# Patient Record
Sex: Female | Born: 1995 | Race: White | Hispanic: No | Marital: Single | Smoking: Never smoker
Health system: Southern US, Community
[De-identification: ages and names within clinical notes are randomized; demographics above are authoritative.]

## PROBLEM LIST (undated history)

## (undated) DIAGNOSIS — R0981 Nasal congestion: Secondary | ICD-10-CM

## (undated) DIAGNOSIS — R2232 Localized swelling, mass and lump, left upper limb: Secondary | ICD-10-CM

## (undated) DIAGNOSIS — F909 Attention-deficit hyperactivity disorder, unspecified type: Secondary | ICD-10-CM

## (undated) HISTORY — PX: TYMPANOSTOMY TUBE PLACEMENT: SHX32

## (undated) HISTORY — PX: WISDOM TOOTH EXTRACTION: SHX21

---
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
## 2013-01-01 LAB — STREP A DNA PROBE: GASP: NEGATIVE

## 2013-01-02 ENCOUNTER — Telehealth: Payer: Self-pay | Admitting: *Deleted

## 2013-04-11 ENCOUNTER — Ambulatory Visit (INDEPENDENT_AMBULATORY_CARE_PROVIDER_SITE_OTHER): Payer: 59 | Admitting: Physician Assistant

## 2013-04-11 ENCOUNTER — Encounter: Payer: Self-pay | Admitting: Physician Assistant

## 2013-04-11 VITALS — BP 117/79 | HR 75 | Wt 140.0 lb

## 2013-04-11 DIAGNOSIS — M6740 Ganglion, unspecified site: Secondary | ICD-10-CM

## 2013-04-11 DIAGNOSIS — M67432 Ganglion, left wrist: Secondary | ICD-10-CM

## 2013-04-11 NOTE — Patient Instructions (Addendum)
Ibuprofen 800mg  up to TID.   Ganglion Cyst A ganglion cyst is a noncancerous, fluid-filled lump that occurs near joints or tendons. The ganglion cyst grows out of a joint or the lining of a tendon. It most often develops in the hand or wrist but can also develop in the shoulder, elbow, hip, knee, ankle, or foot. The round or oval ganglion can be pea sized or larger than a grape. Increased activity may enlarge the size of the cyst because more fluid starts to build up.  CAUSES  It is not completely known what causes a ganglion cyst to grow. However, it may be related to:  Inflammation or irritation around the joint.  An injury.  Repetitive movements or overuse.  Arthritis. SYMPTOMS  A lump most often appears in the hand or wrist, but can occur in other areas of the body. Generally, the lump is painless without other symptoms. However, sometimes pain can be felt during activity or when pressure is applied to the lump. The lump may even be tender to the touch. Tingling, pain, numbness, or muscle weakness can occur if the ganglion cyst presses on a nerve. Your grip may be weak and you may have less movement in your joints.  DIAGNOSIS  Ganglion cysts are most often diagnosed based on a physical exam, noting where the cyst is and how it looks. Your caregiver will feel the lump and may shine a light alongside it. If it is a ganglion, a light often shines through it. Your caregiver may order an X-ray, ultrasound, or MRI to rule out other conditions. TREATMENT  Ganglions usually go away on their own without treatment. If pain or other symptoms are involved, treatment may be needed. Treatment is also needed if the ganglion limits your movement or if it gets infected. Treatment options include:  Wearing a wrist or finger brace or splint.  Taking anti-inflammatory medicine.  Draining fluid from the lump with a needle (aspiration).  Injecting a steroid into the joint.  Surgery to remove the ganglion  cyst and its stalk that is attached to the joint or tendon. However, ganglion cysts can grow back. HOME CARE INSTRUCTIONS   Do not press on the ganglion, poke it with a needle, or hit it with a heavy object. You may rub the lump gently and often. Sometimes fluid moves out of the cyst.  Only take medicines as directed by your caregiver.  Wear your brace or splint as directed by your caregiver. SEEK MEDICAL CARE IF:   Your ganglion becomes larger or more painful.  You have increased redness, red streaks, or swelling.  You have pus coming from the lump.  You have weakness or numbness in the affected area. MAKE SURE YOU:   Understand these instructions.  Will watch your condition.  Will get help right away if you are not doing well or get worse. Document Released: 10/01/2000 Document Revised: 06/28/2012 Document Reviewed: 11/28/2007 [facility] Patient Information 2014 [place], [place].

## 2013-04-11 NOTE — Progress Notes (Signed)
  Subjective:    Patient ID: Patricia Hammond, female    DOB: 1996/03/04, 17 y.o.   MRN: [id number]  HPI Patient 17 year old female who presents to the clinic with a nodule on her left wrist. She has noticed it for about one month now. It is not painful. She is involved in a lot of sports but denies any known repetitive movement of her wrist. It is mobile. She has not tried anything to make better or worse. It does not seem to be increasing in size.   Review of Systems     Objective:   Physical Exam  Skin:             Assessment & Plan:  Ganglion cyst-reassured patient that the cyst is benign. Wrapped with Ace bandage for compression. Encouraged patient to take anti-inflammatories for the next couple of weeks at ibuprofen 800 mg up to 3 times a day. Gave handout on ganglion cyst. Discussed with patient if does not improve can consider injection or aspiration. Followup as needed.

## 2013-04-28 ENCOUNTER — Encounter: Payer: Self-pay | Admitting: *Deleted

## 2013-04-28 ENCOUNTER — Emergency Department (INDEPENDENT_AMBULATORY_CARE_PROVIDER_SITE_OTHER)
Admission: EM | Admit: 2013-04-28 | Discharge: 2013-04-28 | Disposition: A | Discharge status: Home / Self Care | Payer: 59 | Source: Home / Self Care | Attending: Family Medicine | Admitting: Family Medicine

## 2013-04-28 DIAGNOSIS — R300 Dysuria: Secondary | ICD-10-CM

## 2013-04-28 LAB — POCT URINALYSIS DIP (MANUAL ENTRY)
Nitrite, UA: NEGATIVE
Urobilinogen, UA: 0.2 (ref 0–1)
pH, UA: 6 (ref 5–8)

## 2013-04-28 MED ORDER — CEPHALEXIN 500 MG PO CAPS: 500.0000 mg | ORAL_CAPSULE | Freq: Three times a day (TID) | ORAL | Status: DC

## 2013-04-28 NOTE — ED Provider Notes (Signed)
   History    CSN: [id number] Arrival date & time 04/28/13  1725  First MD Initiated Contact with Patient 04/28/13 1726     Chief Complaint  Patient presents with  . Dysuria  . Urinary Urgency    HPI  DYSURIA Onset:  1 day Description: dysuria, increased urinary frequency Modifying factors: none  Symptoms Urgency:  yes Frequency: yes  Hesitancy:  yes Hematuria:  no Flank Pain:  no Fever: no Nausea/Vomiting:  no Missed LMP: no STD exposure: no Discharge: no Irritants: no Rash: no  Red Flags   More than 3 UTI's last 12 months:  no PMH of  Diabetes or Immunosuppression:  no Renal Disease/Calculi: no Urinary Tract Abnormality:  no Instrumentation or Trauma: no    Past Medical History  Diagnosis Date  . ADHD (attention deficit hyperactivity disorder)    Past Surgical History  Procedure Laterality Date  . Tympanostomy tube placement     Family History  Problem Relation Age of Onset  . Hypertension Maternal Aunt   . Hypertension Paternal Aunt   . Diabetes Maternal Grandfather   . Heart disease Maternal Grandfather   . Heart disease Paternal Grandfather    History  Substance Use Topics  . Smoking status: Never Smoker   . Smokeless tobacco: Never Used  . Alcohol Use: No   OB History   Grav Para Term Preterm Abortions TAB SAB Ect Mult Living                 Review of Systems  All other systems reviewed and are negative.    Allergies  Review of patient's allergies indicates no known allergies.  Home Medications   Current Outpatient Rx  Name  Route  Sig  Dispense  Refill  . atomoxetine (STRATTERA) 60 MG capsule   Oral   Take 1 capsule (60 mg total) by mouth daily.   90 capsule   1   . cephALEXin (KEFLEX) 500 MG capsule   Oral   Take 1 capsule (500 mg total) by mouth 3 (three) times daily.   21 capsule   0   . levonorgestrel (MIRENA) 20 MCG/24HR IUD   Intrauterine   1 each by Intrauterine route once.          There were no vitals  taken for this visit. Physical Exam  Constitutional: She appears well-developed and well-nourished.  HENT:  Head: Normocephalic and atraumatic.  Eyes: Conjunctivae are normal. Pupils are equal, round, and reactive to light.  Neck: Normal range of motion. Neck supple.  Cardiovascular: Normal rate, regular rhythm and normal heart sounds.   Pulmonary/Chest: Effort normal and breath sounds normal.  Abdominal: Soft. Bowel sounds are normal.  + suprapubic tenderness  Musculoskeletal: Normal range of motion.  Neurological: She is alert.  Skin: Skin is warm.    ED Course  Procedures (including critical care time) Labs Reviewed - No data to display No results found. 1. Dysuria     MDM  Will treat with keflex Urine culture Discussed infectious and gu red flags.     The patient and/or caregiver has been counseled thoroughly with regard to treatment plan and/or medications prescribed including dosage, schedule, interactions, rationale for use, and possible side effects and they verbalize understanding. Diagnoses and expected course of recovery discussed and will return if not improved as expected or if the condition worsens. Patient and/or caregiver verbalized understanding.       Doree Albee, MD 04/28/13 [phone]

## 2013-04-28 NOTE — ED Notes (Signed)
Patient c/o burning and pain with urination x 3 hours ago. Patient has not tried nothing.

## 2013-05-01 ENCOUNTER — Telehealth: Payer: Self-pay | Admitting: Emergency Medicine

## 2013-11-07 ENCOUNTER — Encounter: Payer: Self-pay | Admitting: Obstetrics & Gynecology

## 2013-11-07 ENCOUNTER — Ambulatory Visit (INDEPENDENT_AMBULATORY_CARE_PROVIDER_SITE_OTHER): Payer: 59 | Admitting: Obstetrics & Gynecology

## 2013-11-07 VITALS — BP 122/80 | HR 74 | Resp 16 | Ht 69.0 in | Wt 141.0 lb

## 2013-11-07 DIAGNOSIS — Z30431 Encounter for routine checking of intrauterine contraceptive device: Secondary | ICD-10-CM

## 2013-11-07 DIAGNOSIS — Z7251 High risk heterosexual behavior: Secondary | ICD-10-CM

## 2013-11-07 DIAGNOSIS — T8339XA Other mechanical complication of intrauterine contraceptive device, initial encounter: Secondary | ICD-10-CM

## 2013-11-07 DIAGNOSIS — T8332XA Displacement of intrauterine contraceptive device, initial encounter: Secondary | ICD-10-CM | POA: Insufficient documentation

## 2013-11-07 NOTE — Progress Notes (Signed)
   Subjective:    Patient ID: Patricia Hammond, female    DOB: 12/29/1995, 18 y.o.   MRN: [id number]  HPI Pt presents for IUD string check.  Pt gets her menses once a month.  Pt has no complaints of pain or discharge.  Pt requesting full STD panel   Review of Systems  All other systems reviewed and are negative.       Objective:   Physical Exam  Constitutional: She appears well-developed and well-nourished.  Pulmonary/Chest: Effort normal.  Abdominal: Soft. She exhibits no distension. There is no tenderness.  Genitourinary: Vagina normal.  Cervix nml in appearance.  No strings visualized.  Musculoskeletal: She exhibits no edema.  Neurological: She is alert.  Skin: Skin is warm and dry.  Psychiatric: She has a normal mood and affect.          Assessment & Plan:  Bedside US limited  IUD Strings seen  STD panel--blood and vaginal.  RTC prn.

## 2013-11-07 NOTE — Addendum Note (Signed)
Addended by: Asencion Islam on: 11/07/2013 11:29 AM   Modules accepted: Orders

## 2013-11-08 ENCOUNTER — Telehealth: Payer: Self-pay | Admitting: *Deleted

## 2013-11-08 LAB — RPR

## 2013-11-08 LAB — WET PREP BY MOLECULAR PROBE
CANDIDA SPECIES: NEGATIVE
GARDNERELLA VAGINALIS: NEGATIVE
TRICHOMONAS VAG: NEGATIVE

## 2013-11-08 LAB — GC/CHLAMYDIA PROBE AMP
CT Probe RNA: NEGATIVE
GC Probe RNA: NEGATIVE

## 2013-11-08 LAB — HEPATITIS B SURFACE ANTIGEN: HEP B S AG: NEGATIVE

## 2013-11-08 LAB — HEPATITIS C ANTIBODY: HCV Ab: NEGATIVE

## 2013-11-08 LAB — HIV ANTIBODY (ROUTINE TESTING W REFLEX): HIV: NONREACTIVE

## 2013-11-08 NOTE — Telephone Encounter (Signed)
Called pt to adv all labs are normal but still waiting on GC/Chlam to be resulted will call when results are in

## 2013-11-18 ENCOUNTER — Encounter (HOSPITAL_COMMUNITY): Payer: Self-pay | Admitting: Emergency Medicine

## 2013-11-18 ENCOUNTER — Emergency Department (HOSPITAL_COMMUNITY)
Admission: EM | Admit: 2013-11-18 | Discharge: 2013-11-18 | Disposition: A | Discharge status: Home / Self Care | Payer: 59 | Attending: Emergency Medicine | Admitting: Emergency Medicine

## 2013-11-18 DIAGNOSIS — R1110 Vomiting, unspecified: Secondary | ICD-10-CM

## 2013-11-18 DIAGNOSIS — R1084 Generalized abdominal pain: Secondary | ICD-10-CM | POA: Insufficient documentation

## 2013-11-18 DIAGNOSIS — R197 Diarrhea, unspecified: Secondary | ICD-10-CM | POA: Insufficient documentation

## 2013-11-18 DIAGNOSIS — Z8659 Personal history of other mental and behavioral disorders: Secondary | ICD-10-CM | POA: Insufficient documentation

## 2013-11-18 DIAGNOSIS — Z3202 Encounter for pregnancy test, result negative: Secondary | ICD-10-CM | POA: Insufficient documentation

## 2013-11-18 DIAGNOSIS — R6883 Chills (without fever): Secondary | ICD-10-CM | POA: Insufficient documentation

## 2013-11-18 DIAGNOSIS — R112 Nausea with vomiting, unspecified: Secondary | ICD-10-CM | POA: Insufficient documentation

## 2013-11-18 LAB — CBC WITH DIFFERENTIAL/PLATELET
Basophils Absolute: 0 10*3/uL (ref 0.0–0.1)
Basophils Relative: 0 % (ref 0–1)
EOS PCT: 0 % (ref 0–5)
Eosinophils Absolute: 0 10*3/uL (ref 0.0–0.7)
HEMATOCRIT: 45.1 % (ref 36.0–46.0)
HEMOGLOBIN: 15.6 g/dL — AB (ref 12.0–15.0)
LYMPHS ABS: 0.4 10*3/uL — AB (ref 0.7–4.0)
LYMPHS PCT: 2 % — AB (ref 12–46)
MCH: 29.9 pg (ref 26.0–34.0)
MCHC: 34.6 g/dL (ref 30.0–36.0)
MCV: 86.4 fL (ref 78.0–100.0)
MONO ABS: 0.8 10*3/uL (ref 0.1–1.0)
Monocytes Relative: 4 % (ref 3–12)
NEUTROS ABS: 18.4 10*3/uL — AB (ref 1.7–7.7)
Neutrophils Relative %: 93 % — ABNORMAL HIGH (ref 43–77)
Platelets: 155 10*3/uL (ref 150–400)
RBC: 5.22 MIL/uL — AB (ref 3.87–5.11)
RDW: 13 % (ref 11.5–15.5)
WBC: 19.7 10*3/uL — AB (ref 4.0–10.5)

## 2013-11-18 LAB — OCCULT BLOOD, POC DEVICE: FECAL OCCULT BLD: POSITIVE — AB

## 2013-11-18 LAB — COMPREHENSIVE METABOLIC PANEL
ALT: 10 U/L (ref 0–35)
AST: 17 U/L (ref 0–37)
Albumin: 4.7 g/dL (ref 3.5–5.2)
Alkaline Phosphatase: 84 U/L (ref 39–117)
BILIRUBIN TOTAL: 0.6 mg/dL (ref 0.3–1.2)
BUN: 19 mg/dL (ref 6–23)
CALCIUM: 9.9 mg/dL (ref 8.4–10.5)
CHLORIDE: 101 meq/L (ref 96–112)
CO2: 23 meq/L (ref 19–32)
CREATININE: 0.77 mg/dL (ref 0.50–1.10)
GLUCOSE: 125 mg/dL — AB (ref 70–99)
Potassium: 4.4 mEq/L (ref 3.7–5.3)
Sodium: 139 mEq/L (ref 137–147)
Total Protein: 8.1 g/dL (ref 6.0–8.3)

## 2013-11-18 LAB — URINALYSIS, ROUTINE W REFLEX MICROSCOPIC
Bilirubin Urine: NEGATIVE
GLUCOSE, UA: NEGATIVE mg/dL
HGB URINE DIPSTICK: NEGATIVE
KETONES UR: NEGATIVE mg/dL
Leukocytes, UA: NEGATIVE
Nitrite: NEGATIVE
PROTEIN: 30 mg/dL — AB
Specific Gravity, Urine: 1.028 (ref 1.005–1.030)
UROBILINOGEN UA: 0.2 mg/dL (ref 0.0–1.0)
pH: 5.5 (ref 5.0–8.0)

## 2013-11-18 LAB — POCT PREGNANCY, URINE: Preg Test, Ur: NEGATIVE

## 2013-11-18 LAB — LIPASE, BLOOD: Lipase: 18 U/L (ref 11–59)

## 2013-11-18 LAB — URINE MICROSCOPIC-ADD ON

## 2013-11-18 MED ORDER — SODIUM CHLORIDE 0.9 % IV SOLN
1000.0000 mL | Freq: Once | INTRAVENOUS | Status: AC
  Administered 2013-11-18: 1000 mL via INTRAVENOUS

## 2013-11-18 MED ORDER — ONDANSETRON HCL 4 MG/2ML IJ SOLN
4.0000 mg | Freq: Once | INTRAMUSCULAR | Status: AC
  Administered 2013-11-18: 4 mg via INTRAVENOUS
  Filled 2013-11-18: qty 2

## 2013-11-18 MED ORDER — PROMETHAZINE HCL 25 MG RE SUPP: 25.0000 mg | Freq: Four times a day (QID) | RECTAL | Status: DC | PRN

## 2013-11-18 MED ORDER — SODIUM CHLORIDE 0.9 % IV BOLUS (SEPSIS)
1000.0000 mL | Freq: Once | INTRAVENOUS | Status: AC
  Administered 2013-11-18: 1000 mL via INTRAVENOUS

## 2013-11-18 MED ORDER — KETOROLAC TROMETHAMINE 30 MG/ML IJ SOLN
30.0000 mg | Freq: Once | INTRAMUSCULAR | Status: DC
  Filled 2013-11-18 (×2): qty 1

## 2013-11-18 MED ORDER — ONDANSETRON 4 MG PO TBDP: 4.0000 mg | ORAL_TABLET | Freq: Three times a day (TID) | ORAL | Status: DC | PRN

## 2013-11-18 NOTE — ED Provider Notes (Signed)
CSN: [id number]     Arrival date & time 11/18/13  2703 History   First MD Initiated Contact with Patient 11/18/13 [phone]     Chief Complaint  Patient presents with  . Emesis  . Diarrhea   (Consider location/radiation/quality/duration/timing/severity/associated sxs/prior Treatment) HPI Comments: Patricia Hammond is a 18 year-old female with a past medical history of ADHD, presenting the Emergency Department with a chief complaint of vomiting and diarrhea.  The patient reports abrupt onset of vomiting since approximately 0100 today.  She reports 10 episodes of vomiting with streaks of blood in the last two emesis.  She reports 8 episodes of "red" diarrhea.  She reports lower abdominal pain. Deneis fever or chills.  Reports she ate spaghetti and meatballs for dinner around 1900 yesterday.  She reports her roommate ate some of her meatballs but does not have the same symptoms.  The patient states she had once glass of wine with dinner last night.    Patient is a 18 y.o. female presenting with vomiting and diarrhea. The history is provided by the patient and a parent. No language interpreter was used.  Emesis Associated symptoms: abdominal pain, chills and diarrhea   Diarrhea Associated symptoms: abdominal pain, chills and vomiting   Associated symptoms: no fever     Past Medical History  Diagnosis Date  . ADHD (attention deficit hyperactivity disorder)    Past Surgical History  Procedure Laterality Date  . Tympanostomy tube placement     Family History  Problem Relation Age of Onset  . Hypertension Maternal Aunt   . Hypertension Paternal Aunt   . Diabetes Maternal Grandfather   . Heart disease Maternal Grandfather   . Heart disease Paternal Grandfather    History  Substance Use Topics  . Smoking status: Never Smoker   . Smokeless tobacco: Never Used  . Alcohol Use: No   OB History   Grav Para Term Preterm Abortions TAB SAB Ect Mult Living   0 0 0 0 0 0 0 0 0 0      Review of  Systems  Constitutional: Positive for chills. Negative for fever.  Gastrointestinal: Positive for nausea, vomiting, abdominal pain and diarrhea.  Genitourinary: Negative for dysuria and flank pain.    Allergies  Review of patient's allergies indicates no known allergies.  Home Medications   Current Outpatient Rx  Name  Route  Sig  Dispense  Refill  . levonorgestrel (MIRENA) 20 MCG/24HR IUD   Intrauterine   1 each by Intrauterine route once.         . ondansetron (ZOFRAN ODT) 4 MG disintegrating tablet   Oral   Take 1 tablet (4 mg total) by mouth every 8 (eight) hours as needed for nausea or vomiting.   10 tablet   0   . promethazine (PHENERGAN) 25 MG suppository   Rectal   Place 1 suppository (25 mg total) rectally every 6 (six) hours as needed for nausea or vomiting.   10 each   0    BP 99/60  Pulse 82  Temp(Src) 99.6 F (37.6 C) (Oral)  Resp 14  SpO2 96%  LMP 11/18/2013 Physical Exam  Nursing note and vitals reviewed. Constitutional: She is oriented to person, place, and time. She appears well-developed and well-nourished.  HENT:  Head: Normocephalic and atraumatic.  Mouth/Throat: Uvula is midline. Mucous membranes are not dry.  Eyes: No scleral icterus.  Neck: Neck supple.  Cardiovascular: Normal rate and regular rhythm.   Pulmonary/Chest: Effort normal and breath sounds  normal. She has no wheezes. She has no rales.  Abdominal: Soft. Normal appearance and bowel sounds are normal. She exhibits no distension. There is generalized tenderness. There is no rebound, no guarding, no CVA tenderness, no tenderness at McBurney's point and negative Murphy's sign.  Minimal lower abdominal pain with palpation.  Genitourinary: Rectal exam shows no external hemorrhoid and no tenderness.  No stool in rectal vault. Chaperone present.  Neurological: She is alert and oriented to person, place, and time.  Skin: Skin is warm and dry.    ED Course  Procedures (including  critical care time) Labs Review Labs Reviewed  CBC WITH DIFFERENTIAL - Abnormal; Notable for the following:    WBC 19.7 (*)    RBC 5.22 (*)    Hemoglobin 15.6 (*)    Neutrophils Relative % 93 (*)    Neutro Abs 18.4 (*)    Lymphocytes Relative 2 (*)    Lymphs Abs 0.4 (*)    All other components within normal limits  COMPREHENSIVE METABOLIC PANEL - Abnormal; Notable for the following:    Glucose, Bld 125 (*)    All other components within normal limits  URINALYSIS, ROUTINE W REFLEX MICROSCOPIC - Abnormal; Notable for the following:    Protein, ur 30 (*)    All other components within normal limits  OCCULT BLOOD, POC DEVICE - Abnormal; Notable for the following:    Fecal Occult Bld POSITIVE (*)    All other components within normal limits  LIPASE, BLOOD  URINE MICROSCOPIC-ADD ON  POCT PREGNANCY, URINE   Imaging Review No results found.  EKG Interpretation   None       MDM   1. Vomiting and diarrhea    Pt with a less than 7 hour history of vomiting and diarrhea. PE unrevealing, mild generalized abdominal pain, afebrile. Likely viral.  Labs, fluids nausea medication given. Re-eval: Patient resting comfortably no further episodes of vomiting or diarrhea.  CBC-WBC shows 19.7, hbg 15.6 with left shift.  CMP-slight elevation of glucose, probably stress response, no other abnormalities.  Lipase WNL. UA-without infection, Pregnancy test negative. Re-eval patient sleeping in room, is able to tolerate fluids in the ED without emesis.  Discussed lab results, and treatment plan with the patient and patient's mother. Return precautions given. Reports understanding and no other concerns at this time.  Patient is stable for discharge at this time.  Meds given in ED:  Medications  0.9 %  sodium chloride infusion (0 mLs Intravenous Stopped 11/18/13 0934)  ondansetron (ZOFRAN) injection 4 mg (4 mg Intravenous Given 11/18/13 0918)  0.9 %  sodium chloride infusion (1,000 mLs Intravenous New  Bag/Given 11/18/13 1013)  sodium chloride 0.9 % bolus 1,000 mL (0 mLs Intravenous Stopped 11/18/13 1043)    Discharge Medication List as of 11/18/2013 11:47 AM    START taking these medications   Details  ondansetron (ZOFRAN ODT) 4 MG disintegrating tablet Take 1 tablet (4 mg total) by mouth every 8 (eight) hours as needed for nausea or vomiting., Starting 11/18/2013, Until Discontinued, Print    promethazine (PHENERGAN) 25 MG suppository Place 1 suppository (25 mg total) rectally every 6 (six) hours as needed for nausea or vomiting., Starting 11/18/2013, Until Discontinued, Print           Lorrine Kin, PA-C 11/21/13 1309

## 2013-11-18 NOTE — Discharge Instructions (Signed)
Call for a follow up appointment with a Family or Primary Care Provider.  Return if Symptoms worsen.   Take medication as prescribed.  Drink plenty of fluids.   Advance your diet as discussed, from clear fluids to B.R.A.T. diet or diarrhea diet.

## 2013-11-18 NOTE — ED Notes (Signed)
Patient reports to ED for bloody emesis and bloody diarrhea starting at 0100 this morning. Pt states she believes that it may be food poisoning from spaghetti and meatballs for dinner. Denies abdominal pain.

## 2013-11-26 NOTE — ED Provider Notes (Signed)
Medical screening examination/treatment/procedure(s) were performed by non-physician practitioner and as supervising physician I was immediately available for consultation/collaboration.  EKG Interpretation   None         Saddie Benders. Johndaniel Catlin, MD 11/26/13 1406

## 2013-12-19 ENCOUNTER — Encounter: Payer: Self-pay | Admitting: Sports Medicine

## 2013-12-19 ENCOUNTER — Ambulatory Visit (INDEPENDENT_AMBULATORY_CARE_PROVIDER_SITE_OTHER): Payer: 59 | Admitting: Sports Medicine

## 2013-12-19 VITALS — BP 114/73 | HR 72 | Ht 69.0 in | Wt 144.0 lb

## 2013-12-19 DIAGNOSIS — M67439 Ganglion, unspecified wrist: Secondary | ICD-10-CM | POA: Insufficient documentation

## 2013-12-19 DIAGNOSIS — M6740 Ganglion, unspecified site: Secondary | ICD-10-CM

## 2013-12-19 NOTE — Assessment & Plan Note (Signed)
Aspiration and injection as above, patient understands 50% recurrence rate. Return in a month.

## 2013-12-19 NOTE — Progress Notes (Signed)
   Subjective:    I'm seeing this patient as a consultation for:  Dr. Madilyn Fireman  CC: Lump on wrist  HPI: This is a very pleasant 18 year old female, for some time now she's had a lump on the dorsum of her left wrist, is starting to become somewhat painful. Moderate, persistent, without radiation. No recent trauma.  Past medical history, Surgical history, Family history not pertinant except as noted below, Social history, Allergies, and medications have been entered into the medical record, reviewed, and no changes needed.   Review of Systems: No headache, visual changes, nausea, vomiting, diarrhea, constipation, dizziness, abdominal pain, skin rash, fevers, chills, night sweats, weight loss, swollen lymph nodes, body aches, joint swelling, muscle aches, chest pain, shortness of breath, mood changes, visual or auditory hallucinations.   Objective:   General: Well Developed, well nourished, and in no acute distress.  Neuro/Psych: Alert and oriented x3, extra-ocular muscles intact, able to move all 4 extremities, sensation grossly intact. Skin: Warm and dry, no rashes noted.  Respiratory: Not using accessory muscles, speaking in full sentences, trachea midline.  Cardiovascular: Pulses palpable, no extremity edema. Abdomen: Does not appear distended. Left Wrist: Inspection normal with no visible erythema or swelling. ROM smooth and normal with good flexion and extension and ulnar/radial deviation that is symmetrical with opposite wrist. There is a visible and palpable ganglion cyst over the dorsal radiocarpal joint. No snuffbox tenderness. No tenderness over Canal of Guyon. Strength 5/5 in all directions without pain. Negative Finkelstein, tinel's and phalens. Negative Watson's test.  Procedure: Real-time Ultrasound Guided aspiration/Injection of dorsal wrist ganglion cyst Device: GE Logiq E  Verbal informed consent obtained.  Time-out conducted.  Noted no overlying erythema,  induration, or other signs of local infection.  Skin prepped in a sterile fashion.  Local anesthesia: Topical Ethyl chloride.  With sterile technique and under real time ultrasound guidance:  22-gauge needle advanced into cyst, minimal fluid was aspirated, syringe which 0.5 cc Kenalog 40, 0.5 cc lidocaine injected. Completed without difficulty  Pain immediately resolved suggesting accurate placement of the medication.  Advised to call if fevers/chills, erythema, induration, drainage, or persistent bleeding.  Images permanently stored and available for review in the ultrasound unit.  Impression: Technically successful ultrasound guided injection.  Impression and Recommendations:   This case required medical decision making of moderate complexity.

## 2013-12-19 NOTE — Patient Instructions (Signed)
Ganglion Cyst °A ganglion cyst is a noncancerous, fluid-filled lump that occurs near joints or tendons. The ganglion cyst grows out of a joint or the lining of a tendon. It most often develops in the hand or wrist but can also develop in the shoulder, elbow, hip, knee, ankle, or foot. The round or oval ganglion can be pea sized or larger than a grape. Increased activity may enlarge the size of the cyst because more fluid starts to build up.  °CAUSES  °It is not completely known what causes a ganglion cyst to grow. However, it may be related to: °· Inflammation or irritation around the joint. °· An injury. °· Repetitive movements or overuse. °· Arthritis. °SYMPTOMS  °A lump most often appears in the hand or wrist, but can occur in other areas of the body. Generally, the lump is painless without other symptoms. However, sometimes pain can be felt during activity or when pressure is applied to the lump. The lump may even be tender to the touch. Tingling, pain, numbness, or muscle weakness can occur if the ganglion cyst presses on a nerve. Your grip may be weak and you may have less movement in your joints.  °DIAGNOSIS  °Ganglion cysts are most often diagnosed based on a physical exam, noting where the cyst is and how it looks. Your caregiver will feel the lump and may shine a light alongside it. If it is a ganglion, a light often shines through it. Your caregiver may order an X-ray, ultrasound, or MRI to rule out other conditions. °TREATMENT  °Ganglions usually go away on their own without treatment. If pain or other symptoms are involved, treatment may be needed. Treatment is also needed if the ganglion limits your movement or if it gets infected. Treatment options include: °· Wearing a wrist or finger brace or splint. °· Taking anti-inflammatory medicine. °· Draining fluid from the lump with a needle (aspiration). °· Injecting a steroid into the joint. °· Surgery to remove the ganglion cyst and its stalk that is  attached to the joint or tendon. However, ganglion cysts can grow back. °HOME CARE INSTRUCTIONS  °· Do not press on the ganglion, poke it with a needle, or hit it with a heavy object. You may rub the lump gently and often. Sometimes fluid moves out of the cyst. °· Only take medicines as directed by your caregiver. °· Wear your brace or splint as directed by your caregiver. °SEEK MEDICAL CARE IF:  °· Your ganglion becomes larger or more painful. °· You have increased redness, red streaks, or swelling. °· You have pus coming from the lump. °· You have weakness or numbness in the affected area. °MAKE SURE YOU:  °· Understand these instructions. °· Will watch your condition. °· Will get help right away if you are not doing well or get worse. °Document Released: 10/01/2000 Document Revised: 06/28/2012 Document Reviewed: 11/28/2007 °[employer]® Patient Information ©2014 [employer]. ° °

## 2014-01-16 ENCOUNTER — Ambulatory Visit: Payer: 59 | Admitting: Sports Medicine

## 2014-01-23 ENCOUNTER — Encounter: Payer: Self-pay | Admitting: Sports Medicine

## 2014-01-23 ENCOUNTER — Ambulatory Visit (INDEPENDENT_AMBULATORY_CARE_PROVIDER_SITE_OTHER): Payer: 59 | Admitting: Sports Medicine

## 2014-01-23 VITALS — BP 110/70 | HR 67 | Ht 69.0 in | Wt 141.0 lb

## 2014-01-23 DIAGNOSIS — M67439 Ganglion, unspecified wrist: Secondary | ICD-10-CM

## 2014-01-23 DIAGNOSIS — M6740 Ganglion, unspecified site: Secondary | ICD-10-CM

## 2014-01-23 NOTE — Assessment & Plan Note (Signed)
Continue to be resolved after initial aspiration and injection, patient understands recurrence rate.  return as needed.

## 2014-01-23 NOTE — Progress Notes (Signed)
  Subjective:    CC: Followup  HPI: Left wrist dorsal ganglion cyst: Aspiration and injection one month ago, returns with symptoms completely resolve.  Past medical history, Surgical history, Family history not pertinant except as noted below, Social history, Allergies, and medications have been entered into the medical record, reviewed, and no changes needed.   Review of Systems: No fevers, chills, night sweats, weight loss, chest pain, or shortness of breath.   Objective:    General: Well Developed, well nourished, and in no acute distress.  Neuro: Alert and oriented x3, extra-ocular muscles intact, sensation grossly intact.  HEENT: Normocephalic, atraumatic, pupils equal round reactive to light, neck supple, no masses, no lymphadenopathy, thyroid nonpalpable.  Skin: Warm and dry, no rashes. Cardiac: Regular rate and rhythm, no murmurs rubs or gallops, no lower extremity edema.  Respiratory: Clear to auscultation bilaterally. Not using accessory muscles, speaking in full sentences. Left Wrist: Inspection normal with no visible erythema or swelling. ROM smooth and normal with good flexion and extension and ulnar/radial deviation that is symmetrical with opposite wrist. Palpation is normal over metacarpals, navicular, lunate, and TFCC; tendons without tenderness/ swelling No snuffbox tenderness. No tenderness over Canal of Guyon. Strength 5/5 in all directions without pain. Negative Finkelstein, tinel's and phalens. Negative Watson's test.  Impression and Recommendations:

## 2014-12-11 ENCOUNTER — Ambulatory Visit (INDEPENDENT_AMBULATORY_CARE_PROVIDER_SITE_OTHER): Payer: 59 | Admitting: Physician Assistant

## 2014-12-11 ENCOUNTER — Encounter: Payer: Self-pay | Admitting: Physician Assistant

## 2014-12-11 VITALS — BP 118/66 | HR 66 | Ht 69.0 in | Wt 141.0 lb

## 2014-12-11 DIAGNOSIS — R1011 Right upper quadrant pain: Secondary | ICD-10-CM | POA: Insufficient documentation

## 2014-12-11 DIAGNOSIS — R1010 Upper abdominal pain, unspecified: Secondary | ICD-10-CM | POA: Diagnosis not present

## 2014-12-11 DIAGNOSIS — R0789 Other chest pain: Secondary | ICD-10-CM | POA: Insufficient documentation

## 2014-12-11 DIAGNOSIS — R079 Chest pain, unspecified: Secondary | ICD-10-CM | POA: Insufficient documentation

## 2014-12-11 MED ORDER — OMEPRAZOLE 40 MG PO CPDR: 40.0000 mg | DELAYED_RELEASE_CAPSULE | Freq: Every day | ORAL | Status: DC

## 2014-12-11 NOTE — Patient Instructions (Signed)
Try omeprazole for next month or so.  Get labs.  Follow up if would like stress test.

## 2014-12-11 NOTE — Progress Notes (Signed)
   Subjective:    Patient ID: Patricia Hammond, female    DOB: 11-25-1995, 19 y.o.   MRN: [id number]  HPI  Pt is a 19 yo female who presents to the clinic with atypical chest tightness and pain that has been present for "years". She tracked it at one point 5 years ago and no known trigger. She has symptoms at rest and with exertion.  She has mentioned it to numerous providers and they have all passed over it. Yesterday symptoms seem a little worse and she had tightness over RUQ and in Left chest. Lasted for 40 minutes this episode. Nothing seems to make better except for laying still.  No fever, chills, palpitations. She denies gas or abnormal bowel movement. She has soft bowel movements daily. She has been seen in the past for acid reflux but never had treatment. She denies any known anxiety.        Review of Systems  All other systems reviewed and are negative.      Objective:   Physical Exam  Constitutional: She is oriented to person, place, and time. She appears well-developed and well-nourished.  Cardiovascular: Normal rate, regular rhythm and normal heart sounds.   Pulmonary/Chest: Effort normal and breath sounds normal. She has no wheezes.  Abdominal: Soft. Bowel sounds are normal. She exhibits no mass. There is no tenderness. There is no rebound and no guarding.  Neurological: She is alert and oriented to person, place, and time.  Skin: Skin is dry.  Psychiatric: She has a normal mood and affect. Her behavior is normal.          Assessment & Plan:  aytpical chest pain- unclear etiology. Been present for years do not suspect cardiac in nature.  EKG- NSR with sinus arrhythmia at 68, no ST elevation or depression, no acute changes.  GAD-7 was 5. Do not suspect anxiety as component.  Ordered TSH, CBC, ferritin.  Could be some gas and acid reflux issues. Will start prilosec 40mg  in am for next month.  If continuing to have symptoms or worsening could consider stress test.   Certainly if symptoms continue might need to also consider diet changes thus not having in abdominal or bowel symptoms.

## 2014-12-12 LAB — TSH: TSH: 2.095 u[IU]/mL (ref 0.350–4.500)

## 2014-12-12 LAB — CBC
HCT: 44.7 % (ref 36.0–46.0)
HEMOGLOBIN: 15.1 g/dL — AB (ref 12.0–15.0)
MCH: 30 pg (ref 26.0–34.0)
MCHC: 33.8 g/dL (ref 30.0–36.0)
MCV: 88.7 fL (ref 78.0–100.0)
MPV: 13.3 fL — ABNORMAL HIGH (ref 8.6–12.4)
Platelets: 182 10*3/uL (ref 150–400)
RBC: 5.04 MIL/uL (ref 3.87–5.11)
RDW: 12.7 % (ref 11.5–15.5)
WBC: 6.5 10*3/uL (ref 4.0–10.5)

## 2014-12-12 LAB — FERRITIN: FERRITIN: 35 ng/mL (ref 10–291)

## 2015-01-29 ENCOUNTER — Encounter: Payer: Self-pay | Admitting: Obstetrics & Gynecology

## 2015-01-29 ENCOUNTER — Ambulatory Visit (INDEPENDENT_AMBULATORY_CARE_PROVIDER_SITE_OTHER): Payer: 59 | Admitting: Obstetrics & Gynecology

## 2015-01-29 VITALS — BP 111/67 | HR 66 | Resp 16 | Ht 69.0 in | Wt 143.0 lb

## 2015-01-29 DIAGNOSIS — Z113 Encounter for screening for infections with a predominantly sexual mode of transmission: Secondary | ICD-10-CM

## 2015-01-29 DIAGNOSIS — N9489 Other specified conditions associated with female genital organs and menstrual cycle: Secondary | ICD-10-CM

## 2015-01-29 DIAGNOSIS — R102 Pelvic and perineal pain: Secondary | ICD-10-CM

## 2015-01-29 NOTE — Progress Notes (Signed)
   Subjective:    Patient ID: Patricia Hammond, female    DOB: 1995/11/12, 19 y.o.   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 It resolved, but she would like STI testing. Coitarche at about 37, about 11 partners since then. She doesn't use condoms as she doesn't like them.  Review of Systems She had the Gardasil series.    Objective:   Physical Exam  WNWHWFNAD Abd- flat, benign EG- shaved, no lesions Her IUD strings are very, very short but seen at the os Normal appearing discharge      Assessment & Plan:  Preventative care- rec condoms and do STI check

## 2015-01-30 ENCOUNTER — Telehealth: Payer: Self-pay | Admitting: *Deleted

## 2015-01-30 LAB — HEPATITIS B SURFACE ANTIGEN: HEP B S AG: NEGATIVE

## 2015-01-30 LAB — GC/CHLAMYDIA PROBE AMP
CT Probe RNA: NEGATIVE
GC Probe RNA: NEGATIVE

## 2015-01-30 LAB — URINE CULTURE
Colony Count: NO GROWTH
Organism ID, Bacteria: NO GROWTH

## 2015-01-30 LAB — RPR

## 2015-01-30 LAB — HEPATITIS C ANTIBODY: HCV Ab: NEGATIVE

## 2015-01-30 LAB — HIV ANTIBODY (ROUTINE TESTING W REFLEX): HIV 1&2 Ab, 4th Generation: NONREACTIVE

## 2015-01-30 NOTE — Telephone Encounter (Signed)
Attempted to reach pt by phone but mailbox has not been set up.

## 2015-03-13 ENCOUNTER — Encounter: Payer: Self-pay | Admitting: Sports Medicine

## 2015-03-13 ENCOUNTER — Ambulatory Visit (INDEPENDENT_AMBULATORY_CARE_PROVIDER_SITE_OTHER): Payer: 59 | Admitting: Sports Medicine

## 2015-03-13 VITALS — BP 118/75 | HR 64 | Ht 69.0 in | Wt 144.0 lb

## 2015-03-13 DIAGNOSIS — M67432 Ganglion, left wrist: Secondary | ICD-10-CM

## 2015-03-13 NOTE — Progress Notes (Signed)
  Subjective:    CC: Wrist pain  HPI: Patricia Hammond is a very pleasant 19 year old female, she works as a Programme researcher, broadcasting/film/video at [employer], 15 months ago I aspirated and injected a left dorsal wrist ganglion cyst and she did extremely well until this month. She desires repeat interventional treatment today, pain is mild, persistent without radiation.  Past medical history, Surgical history, Family history not pertinant except as noted below, Social history, Allergies, and medications have been entered into the medical record, reviewed, and no changes needed.   Review of Systems: No fevers, chills, night sweats, weight loss, chest pain, or shortness of breath.   Objective:    General: Well Developed, well nourished, and in no acute distress.  Neuro: Alert and oriented x3, extra-ocular muscles intact, sensation grossly intact.  HEENT: Normocephalic, atraumatic, pupils equal round reactive to light, neck supple, no masses, no lymphadenopathy, thyroid nonpalpable.  Skin: Warm and dry, no rashes. Cardiac: Regular rate and rhythm, no murmurs rubs or gallops, no lower extremity edema.  Respiratory: Clear to auscultation bilaterally. Not using accessory muscles, speaking in full sentences. Left Wrist: There is a visible dorsal ganglion cyst, approximately 1 cm and tender Palpation is normal over metacarpals, navicular, lunate, and TFCC; tendons without tenderness/ swelling No snuffbox tenderness. No tenderness over Canal of Guyon. Strength 5/5 in all directions without pain. Negative Finkelstein, tinel's and phalens. Negative Watson's test.  Procedure: Real-time Ultrasound Guided Injection of left dorsal ganglion cyst Device: GE Logiq E  Verbal informed consent obtained.  Time-out conducted.  Noted no overlying erythema, induration, or other signs of local infection.  Skin prepped in a sterile fashion.  Local anesthesia: Topical Ethyl chloride.  With sterile technique and under real time  ultrasound guidance:  0.5 mL kenalog 40, 0.5 mL lidocaine injected easily. Completed without difficulty  Pain immediately resolved suggesting accurate placement of the medication.  Advised to call if fevers/chills, erythema, induration, drainage, or persistent bleeding.  Images permanently stored and available for review in the ultrasound unit.  Impression: Technically successful ultrasound guided injection.  Impression and Recommendations:

## 2015-03-13 NOTE — Assessment & Plan Note (Signed)
Nearly a 15 month response to the prior injection.  Repeat injection today. Velcro brace to be worn as often as possible. Return to see me in one month to make sure things continue to stay gone.

## 2015-11-19 ENCOUNTER — Encounter: Payer: Self-pay | Admitting: Obstetrics & Gynecology

## 2015-11-19 ENCOUNTER — Ambulatory Visit (INDEPENDENT_AMBULATORY_CARE_PROVIDER_SITE_OTHER): Payer: 59 | Admitting: Obstetrics & Gynecology

## 2015-11-19 VITALS — BP 115/68 | HR 74 | Resp 16 | Ht 69.0 in | Wt 153.0 lb

## 2015-11-19 DIAGNOSIS — Z30431 Encounter for routine checking of intrauterine contraceptive device: Secondary | ICD-10-CM

## 2015-11-19 DIAGNOSIS — T8389XA Other specified complication of genitourinary prosthetic devices, implants and grafts, initial encounter: Secondary | ICD-10-CM

## 2015-11-19 DIAGNOSIS — Z01419 Encounter for gynecological examination (general) (routine) without abnormal findings: Secondary | ICD-10-CM | POA: Diagnosis not present

## 2015-11-19 DIAGNOSIS — Z113 Encounter for screening for infections with a predominantly sexual mode of transmission: Secondary | ICD-10-CM

## 2015-11-19 DIAGNOSIS — Z124 Encounter for screening for malignant neoplasm of cervix: Secondary | ICD-10-CM | POA: Diagnosis not present

## 2015-11-19 MED ORDER — LEVONORGESTREL 20 MCG/24HR IU IUD: INTRAUTERINE_SYSTEM | Freq: Once | INTRAUTERINE | Status: DC

## 2015-11-19 NOTE — Progress Notes (Signed)
  Subjective:     Patricia Hammond is a 19 y.o. female here for a routine exam.  Current complaints: gas during menses.  Sexually active one partner.  Likes Mirena.   Gynecologic History No LMP recorded. Contraception: IUD Last Pap: never.  First on today.  Obstetric History OB History  Gravida Para Term Preterm AB SAB TAB Ectopic Multiple Living  0 0 0 0 0 0 0 0 0 0          The following portions of the patient's history were reviewed and updated as appropriate: allergies, current medications, past family history, past medical history, past social history, past surgical history and problem list.  Review of Systems Pertinent items noted in HPI and remainder of comprehensive ROS otherwise negative.    Objective:      Filed Vitals:   11/19/15 1046  BP: 115/68  Pulse: 74  Resp: 16  Height: 5\' 9"  (1.753 m)  Weight: 153 lb (69.4 kg)   Vitals:  WNL General appearance: alert, cooperative and no distress  HEENT: Normocephalic, without obvious abnormality, atraumatic Eyes: negative Throat: lips, mucosa, and tongue normal; teeth and gums normal  Respiratory: Clear to auscultation bilaterally  CV: Regular rate and rhythm  Breasts:  Normal appearance, no masses or tenderness, no nipple retraction or dimpling  GI: Soft, non-tender; bowel sounds normal; no masses,  no organomegaly  GU: External Genitalia:  Tanner V, no lesion Urethra:  No prolapse   Vagina: Pink, normal rugae, no blood or discharge  Cervix: No CMT, no lesion; STRINGS NOT SEEN  Uterus:  Normal size and contour, non tender  Adnexa: Normal, no masses, non tender  Musculoskeletal: No edema, redness or tenderness in the calves or thighs  Skin: No lesions or rash  Lymphatic: Axillary adenopathy: none    Psychiatric: Normal mood and behavior          Assessment:    Healthy female exam.   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 Contraception: IUD. Follow up in: 1 year.    Will need premedication with Xanax 0.5 mg (1 tablet 1 hour prior to procedure and one tablet on arrival) and cytotec 400 mcg.  Pt was anxious during speculum exam.

## 2015-11-20 LAB — CYTOLOGY - PAP

## 2016-07-13 ENCOUNTER — Telehealth: Payer: Self-pay | Admitting: Sports Medicine

## 2016-07-13 DIAGNOSIS — M67432 Ganglion, left wrist: Secondary | ICD-10-CM

## 2016-07-13 DIAGNOSIS — M67439 Ganglion, unspecified wrist: Secondary | ICD-10-CM

## 2016-07-13 NOTE — Telephone Encounter (Signed)
Would like a referral to Dr. Gavin Pound at [facility] for Lt wrist cyst. Will pend and route.

## 2016-09-06 DIAGNOSIS — R2232 Localized swelling, mass and lump, left upper limb: Secondary | ICD-10-CM | POA: Diagnosis not present

## 2016-10-18 DIAGNOSIS — R2232 Localized swelling, mass and lump, left upper limb: Secondary | ICD-10-CM

## 2016-10-18 HISTORY — DX: Localized swelling, mass and lump, left upper limb: R22.32

## 2016-10-19 ENCOUNTER — Telehealth: Payer: Self-pay | Admitting: *Deleted

## 2016-10-19 DIAGNOSIS — Z30433 Encounter for removal and reinsertion of intrauterine contraceptive device: Secondary | ICD-10-CM

## 2016-10-19 MED ORDER — MISOPROSTOL 200 MCG PO TABS: ORAL_TABLET | ORAL | 0 refills | Status: DC

## 2016-10-19 MED ORDER — ALPRAZOLAM 0.5 MG PO TABS: ORAL_TABLET | ORAL | 0 refills | Status: DC

## 2016-10-19 NOTE — Telephone Encounter (Signed)
Pt scheduled for Mirena removal and replacement in Feb.  Per Dr Gala Romney Cytotec and Xanax .5 mg given to take prior to procedure.

## 2016-10-25 ENCOUNTER — Ambulatory Visit: Payer: Self-pay | Admitting: Orthopedic Surgery

## 2016-11-05 ENCOUNTER — Encounter (HOSPITAL_BASED_OUTPATIENT_CLINIC_OR_DEPARTMENT_OTHER): Payer: Self-pay | Admitting: *Deleted

## 2016-11-05 DIAGNOSIS — R0981 Nasal congestion: Secondary | ICD-10-CM

## 2016-11-05 HISTORY — DX: Nasal congestion: R09.81

## 2016-11-11 NOTE — Anesthesia Preprocedure Evaluation (Addendum)
Anesthesia Evaluation  Patient identified by MRN, date of birth, ID band Patient awake  General Assessment Comment:Patient denies pregnancy. Discussed in detail.  Reviewed: Allergy & Precautions, NPO status , Patient's Chart, lab work & pertinent test results  Airway Mallampati: I  TM Distance: >3 FB     Dental   Pulmonary    breath sounds clear to auscultation       Cardiovascular negative cardio ROS   Rhythm:Regular     Neuro/Psych    GI/Hepatic negative GI ROS, Neg liver ROS,   Endo/Other  negative endocrine ROS  Renal/GU negative Renal ROS     Musculoskeletal   Abdominal   Peds  Hematology   Anesthesia Other Findings   Reproductive/Obstetrics                            Anesthesia Physical Anesthesia Plan  ASA: I  Anesthesia Plan: General   Post-op Pain Management:    Induction: Intravenous  Airway Management Planned: LMA  Additional Equipment:   Intra-op Plan:   Post-operative Plan:   Informed Consent: I have reviewed the patients History and Physical, chart, labs and discussed the procedure including the risks, benefits and alternatives for the proposed anesthesia with the patient or authorized representative who has indicated his/her understanding and acceptance.   Dental advisory given  Plan Discussed with: CRNA and Anesthesiologist  Anesthesia Plan Comments:         Anesthesia Quick Evaluation

## 2016-11-12 ENCOUNTER — Encounter (HOSPITAL_BASED_OUTPATIENT_CLINIC_OR_DEPARTMENT_OTHER)
Admission: RE | Disposition: A | Discharge status: Home / Self Care | Payer: Self-pay | Source: Ambulatory Visit | Attending: Orthopedic Surgery

## 2016-11-12 ENCOUNTER — Encounter (HOSPITAL_BASED_OUTPATIENT_CLINIC_OR_DEPARTMENT_OTHER): Payer: Self-pay | Admitting: *Deleted

## 2016-11-12 ENCOUNTER — Ambulatory Visit (HOSPITAL_BASED_OUTPATIENT_CLINIC_OR_DEPARTMENT_OTHER): Payer: 59 | Admitting: Anesthesiology

## 2016-11-12 ENCOUNTER — Ambulatory Visit (HOSPITAL_BASED_OUTPATIENT_CLINIC_OR_DEPARTMENT_OTHER)
Admission: RE | Admit: 2016-11-12 | Discharge: 2016-11-12 | Disposition: A | Discharge status: Home / Self Care | Payer: 59 | Source: Ambulatory Visit | Attending: Orthopedic Surgery | Admitting: Orthopedic Surgery

## 2016-11-12 DIAGNOSIS — D2112 Benign neoplasm of connective and other soft tissue of left upper limb, including shoulder: Secondary | ICD-10-CM | POA: Diagnosis not present

## 2016-11-12 DIAGNOSIS — M65842 Other synovitis and tenosynovitis, left hand: Secondary | ICD-10-CM | POA: Diagnosis not present

## 2016-11-12 DIAGNOSIS — M25839 Other specified joint disorders, unspecified wrist: Secondary | ICD-10-CM | POA: Insufficient documentation

## 2016-11-12 DIAGNOSIS — R079 Chest pain, unspecified: Secondary | ICD-10-CM | POA: Diagnosis not present

## 2016-11-12 DIAGNOSIS — G5642 Causalgia of left upper limb: Secondary | ICD-10-CM | POA: Diagnosis not present

## 2016-11-12 DIAGNOSIS — M25532 Pain in left wrist: Secondary | ICD-10-CM | POA: Diagnosis not present

## 2016-11-12 DIAGNOSIS — F909 Attention-deficit hyperactivity disorder, unspecified type: Secondary | ICD-10-CM | POA: Diagnosis not present

## 2016-11-12 DIAGNOSIS — M67432 Ganglion, left wrist: Secondary | ICD-10-CM | POA: Diagnosis not present

## 2016-11-12 HISTORY — PX: EXCISION MASS UPPER EXTREMETIES: SHX6704

## 2016-11-12 HISTORY — DX: Nasal congestion: R09.81

## 2016-11-12 HISTORY — DX: Localized swelling, mass and lump, left upper limb: R22.32

## 2016-11-12 SURGERY — EXCISION MASS UPPER EXTREMITIES
Anesthesia: General | Site: Wrist | Laterality: Left

## 2016-11-12 MED ORDER — MIDAZOLAM HCL 2 MG/2ML IJ SOLN
INTRAMUSCULAR | Status: AC
  Filled 2016-11-12: qty 2

## 2016-11-12 MED ORDER — SCOPOLAMINE 1 MG/3DAYS TD PT72: 1.0000 | MEDICATED_PATCH | Freq: Once | TRANSDERMAL | Status: DC | PRN

## 2016-11-12 MED ORDER — CEFAZOLIN SODIUM-DEXTROSE 2-4 GM/100ML-% IV SOLN
2.0000 g | INTRAVENOUS | Status: AC
  Administered 2016-11-12: 2 g via INTRAVENOUS

## 2016-11-12 MED ORDER — BUPIVACAINE HCL (PF) 0.25 % IJ SOLN
INTRAMUSCULAR | Status: AC
  Filled 2016-11-12: qty 30

## 2016-11-12 MED ORDER — ONDANSETRON HCL 4 MG/2ML IJ SOLN
INTRAMUSCULAR | Status: AC
  Filled 2016-11-12: qty 2

## 2016-11-12 MED ORDER — LACTATED RINGERS IV SOLN
INTRAVENOUS | Status: DC
  Administered 2016-11-12 (×2): via INTRAVENOUS

## 2016-11-12 MED ORDER — FENTANYL CITRATE (PF) 100 MCG/2ML IJ SOLN
INTRAMUSCULAR | Status: DC | PRN
  Administered 2016-11-12 (×2): 25 ug via INTRAVENOUS
  Administered 2016-11-12: 100 ug via INTRAVENOUS

## 2016-11-12 MED ORDER — PROPOFOL 10 MG/ML IV BOLUS
INTRAVENOUS | Status: AC
  Filled 2016-11-12: qty 20

## 2016-11-12 MED ORDER — FENTANYL CITRATE (PF) 100 MCG/2ML IJ SOLN: 25.0000 ug | INTRAMUSCULAR | Status: DC | PRN

## 2016-11-12 MED ORDER — DEXAMETHASONE SODIUM PHOSPHATE 4 MG/ML IJ SOLN
INTRAMUSCULAR | Status: DC | PRN
  Administered 2016-11-12: 10 mg via INTRAVENOUS

## 2016-11-12 MED ORDER — HYDROCODONE-ACETAMINOPHEN 5-325 MG PO TABS: 2.0000 | ORAL_TABLET | Freq: Four times a day (QID) | ORAL | 0 refills | Status: DC | PRN

## 2016-11-12 MED ORDER — PROPOFOL 10 MG/ML IV BOLUS
INTRAVENOUS | Status: DC | PRN
  Administered 2016-11-12: 150 mg via INTRAVENOUS

## 2016-11-12 MED ORDER — BUPIVACAINE HCL (PF) 0.25 % IJ SOLN
INTRAMUSCULAR | Status: DC | PRN
  Administered 2016-11-12: 9 mL

## 2016-11-12 MED ORDER — CHLORHEXIDINE GLUCONATE 4 % EX LIQD: 60.0000 mL | Freq: Once | CUTANEOUS | Status: DC

## 2016-11-12 MED ORDER — MIDAZOLAM HCL 2 MG/2ML IJ SOLN: 1.0000 mg | INTRAMUSCULAR | Status: DC | PRN

## 2016-11-12 MED ORDER — LIDOCAINE HCL (CARDIAC) 20 MG/ML IV SOLN
INTRAVENOUS | Status: DC | PRN
  Administered 2016-11-12: 30 mg via INTRAVENOUS

## 2016-11-12 MED ORDER — CEFAZOLIN SODIUM-DEXTROSE 2-4 GM/100ML-% IV SOLN
INTRAVENOUS | Status: AC
  Filled 2016-11-12: qty 100

## 2016-11-12 MED ORDER — ONDANSETRON HCL 4 MG/2ML IJ SOLN
INTRAMUSCULAR | Status: DC | PRN
  Administered 2016-11-12: 4 mg via INTRAVENOUS

## 2016-11-12 MED ORDER — BUPIVACAINE HCL (PF) 0.5 % IJ SOLN
INTRAMUSCULAR | Status: AC
  Filled 2016-11-12: qty 60

## 2016-11-12 MED ORDER — FENTANYL CITRATE (PF) 100 MCG/2ML IJ SOLN
INTRAMUSCULAR | Status: AC
  Filled 2016-11-12: qty 2

## 2016-11-12 MED ORDER — DEXAMETHASONE SODIUM PHOSPHATE 10 MG/ML IJ SOLN
INTRAMUSCULAR | Status: AC
  Filled 2016-11-12: qty 1

## 2016-11-12 MED ORDER — LIDOCAINE 2% (20 MG/ML) 5 ML SYRINGE
INTRAMUSCULAR | Status: AC
  Filled 2016-11-12: qty 5

## 2016-11-12 MED ORDER — FENTANYL CITRATE (PF) 100 MCG/2ML IJ SOLN: 50.0000 ug | INTRAMUSCULAR | Status: DC | PRN

## 2016-11-12 MED ORDER — MIDAZOLAM HCL 5 MG/5ML IJ SOLN
INTRAMUSCULAR | Status: DC | PRN
  Administered 2016-11-12: 2 mg via INTRAVENOUS

## 2016-11-12 MED FILL — HYDROCODON-APAP 5-325: 5-325 | 5 days supply | Qty: 40 | Fill #0

## 2016-11-12 SURGICAL SUPPLY — 60 items
BANDAGE ACE 3X5.8 VEL STRL LF (GAUZE/BANDAGES/DRESSINGS) ×6 IMPLANT
BLADE SURG 15 STRL LF DISP TIS (BLADE) ×2 IMPLANT
BLADE SURG 15 STRL SS (BLADE) ×4
BNDG COHESIVE 3X5 TAN STRL LF (GAUZE/BANDAGES/DRESSINGS) IMPLANT
BNDG CONFORM 3 STRL LF (GAUZE/BANDAGES/DRESSINGS) ×3 IMPLANT
BNDG GAUZE ELAST 4 BULKY (GAUZE/BANDAGES/DRESSINGS) ×3 IMPLANT
BRUSH SCRUB EZ PLAIN DRY (MISCELLANEOUS) ×3 IMPLANT
CLOSURE WOUND 1/2 X4 (GAUZE/BANDAGES/DRESSINGS) ×1
CORDS BIPOLAR (ELECTRODE) ×3 IMPLANT
COVER BACK TABLE 60X90IN (DRAPES) ×3 IMPLANT
CUFF TOURNIQUET SINGLE 18IN (TOURNIQUET CUFF) ×3 IMPLANT
DECANTER SPIKE VIAL GLASS SM (MISCELLANEOUS) IMPLANT
DRAPE EXTREMITY T 121X128X90 (DRAPE) ×3 IMPLANT
DRAPE SURG 17X23 STRL (DRAPES) ×3 IMPLANT
DRSG EMULSION OIL 3X3 NADH (GAUZE/BANDAGES/DRESSINGS) ×3 IMPLANT
GAUZE SPONGE 4X4 16PLY XRAY LF (GAUZE/BANDAGES/DRESSINGS) IMPLANT
GLOVE BIO SURGEON STRL SZ8 (GLOVE) IMPLANT
GLOVE BIOGEL M STRL SZ7.5 (GLOVE) IMPLANT
GLOVE BIOGEL PI IND STRL 6.5 (GLOVE) ×1 IMPLANT
GLOVE BIOGEL PI IND STRL 7.0 (GLOVE) ×2 IMPLANT
GLOVE BIOGEL PI IND STRL 7.5 (GLOVE) ×1 IMPLANT
GLOVE BIOGEL PI INDICATOR 6.5 (GLOVE) ×2
GLOVE BIOGEL PI INDICATOR 7.0 (GLOVE) ×4
GLOVE BIOGEL PI INDICATOR 7.5 (GLOVE) ×2
GLOVE SS BIOGEL STRL SZ 8 (GLOVE) ×1 IMPLANT
GLOVE SUPERSENSE BIOGEL SZ 8 (GLOVE) ×2
GLOVE SURG SS PI 7.5 STRL IVOR (GLOVE) ×3 IMPLANT
GOWN STRL REUS W/ TWL LRG LVL3 (GOWN DISPOSABLE) ×1 IMPLANT
GOWN STRL REUS W/ TWL XL LVL3 (GOWN DISPOSABLE) ×2 IMPLANT
GOWN STRL REUS W/TWL LRG LVL3 (GOWN DISPOSABLE) ×2
GOWN STRL REUS W/TWL XL LVL3 (GOWN DISPOSABLE) ×4
LOOP VESSEL MAXI BLUE (MISCELLANEOUS) IMPLANT
NEEDLE HYPO 22GX1.5 SAFETY (NEEDLE) IMPLANT
NEEDLE HYPO 25X1 1.5 SAFETY (NEEDLE) ×3 IMPLANT
NS IRRIG 1000ML POUR BTL (IV SOLUTION) ×3 IMPLANT
PACK BASIN DAY SURGERY FS (CUSTOM PROCEDURE TRAY) ×3 IMPLANT
PAD ALCOHOL SWAB (MISCELLANEOUS) IMPLANT
PAD CAST 3X4 CTTN HI CHSV (CAST SUPPLIES) ×2 IMPLANT
PADDING CAST ABS 3INX4YD NS (CAST SUPPLIES) ×2
PADDING CAST ABS 4INX4YD NS (CAST SUPPLIES)
PADDING CAST ABS COTTON 3X4 (CAST SUPPLIES) ×1 IMPLANT
PADDING CAST ABS COTTON 4X4 ST (CAST SUPPLIES) IMPLANT
PADDING CAST COTTON 3X4 STRL (CAST SUPPLIES) ×4
SHEET MEDIUM DRAPE 40X70 STRL (DRAPES) ×3 IMPLANT
SPLINT FIBERGLASS 3X35 (CAST SUPPLIES) ×3 IMPLANT
SPLINT PLASTER CAST XFAST 3X15 (CAST SUPPLIES) IMPLANT
SPLINT PLASTER CAST XFAST 4X15 (CAST SUPPLIES) IMPLANT
SPLINT PLASTER XTRA FAST SET 4 (CAST SUPPLIES)
SPLINT PLASTER XTRA FASTSET 3X (CAST SUPPLIES)
SPONGE GAUZE 4X4 12PLY STER LF (GAUZE/BANDAGES/DRESSINGS) ×3 IMPLANT
STOCKINETTE 4X48 STRL (DRAPES) ×3 IMPLANT
STOCKINETTE SYNTHETIC 3 UNSTER (CAST SUPPLIES) ×3 IMPLANT
STOCKINETTE SYNTHETIC 4 NONSTR (MISCELLANEOUS) IMPLANT
STRIP CLOSURE SKIN 1/2X4 (GAUZE/BANDAGES/DRESSINGS) ×2 IMPLANT
SUT PROLENE 4 0 PS 2 18 (SUTURE) ×3 IMPLANT
SYR BULB 3OZ (MISCELLANEOUS) ×3 IMPLANT
SYR CONTROL 10ML LL (SYRINGE) ×3 IMPLANT
TOWEL OR 17X24 6PK STRL BLUE (TOWEL DISPOSABLE) ×3 IMPLANT
TOWEL OR NON WOVEN STRL DISP B (DISPOSABLE) ×3 IMPLANT
UNDERPAD 30X30 (UNDERPADS AND DIAPERS) ×3 IMPLANT

## 2016-11-12 NOTE — Op Note (Signed)
See dictation#[id number] Amedeo Plenty MD

## 2016-11-12 NOTE — Discharge Instructions (Signed)
Keep bandage clean and dry.  Call for any problems.  No smoking.  Criteria for driving a car: you should be off your pain medicine for 7-8 hours, able to drive one handed(confident), thinking clearly and feeling able in your judgement to drive. Continue elevation as it will decrease swelling.  If instructed by MD move your fingers within the confines of the bandage/splint.  Use ice if instructed by your MD. Call immediately for any sudden loss of feeling in your hand/arm or change in functional abilities of the extremity.We recommend that you to take vitamin C 1000 mg a day to promote healing. We also recommend that if you require  pain medicine that you take a stool softener to prevent constipation as most pain medicines will have constipation side effects. We recommend either Peri-Colace or Senokot and recommend that you also consider adding MiraLAX as well to prevent the constipation affects from pain medicine if you are required to use them. These medicines are over the counter and may be purchased at a local pharmacy. A cup of yogurt and a probiotic can also be helpful during the recovery process as the medicines can disrupt your intestinal environment.   Call your surgeon if you experience:   1.  Fever over 101.0. 2.  Inability to urinate. 3.  Nausea and/or vomiting. 4.  Extreme swelling or bruising at the surgical site. 5.  Continued bleeding from the incision. 6.  Increased pain, redness or drainage from the incision. 7.  Problems related to your pain medication. 8.  Any problems and/or concerns   Post Anesthesia Home Care Instructions  Activity: Get plenty of rest for the remainder of the day. A responsible adult should stay with you for 24 hours following the procedure.  For the next 24 hours, DO NOT: -Drive a car -Paediatric nurse -Drink alcoholic beverages -Take any medication unless instructed by your physician -Make any legal decisions or sign important  papers.  Meals: Start with liquid foods such as gelatin or soup. Progress to regular foods as tolerated. Avoid greasy, spicy, heavy foods. If nausea and/or vomiting occur, drink only clear liquids until the nausea and/or vomiting subsides. Call your physician if vomiting continues.  Special Instructions/Symptoms: Your throat may feel dry or sore from the anesthesia or the breathing tube placed in your throat during surgery. If this causes discomfort, gargle with warm salt water. The discomfort should disappear within 24 hours.  If you had a scopolamine patch placed behind your ear for the management of post- operative nausea and/or vomiting:  1. The medication in the patch is effective for 72 hours, after which it should be removed.  Wrap patch in a tissue and discard in the trash. Wash hands thoroughly with soap and water. 2. You may remove the patch earlier than 72 hours if you experience unpleasant side effects which may include dry mouth, dizziness or visual disturbances. 3. Avoid touching the patch. Wash your hands with soap and water after contact with the patch.

## 2016-11-12 NOTE — Anesthesia Procedure Notes (Signed)
Procedure Name: LMA Insertion Date/Time: 11/12/2016 7:42 AM Performed by: Toula Moos L Pre-anesthesia Checklist: Patient identified, Emergency Drugs available, Suction available, Patient being monitored and Timeout performed Patient Re-evaluated:Patient Re-evaluated prior to inductionOxygen Delivery Method: Circle system utilized Preoxygenation: Pre-oxygenation with 100% oxygen Intubation Type: IV induction Ventilation: Mask ventilation without difficulty LMA: LMA inserted LMA Size: 4.0 Number of attempts: 1 Airway Equipment and Method: Bite block Placement Confirmation: positive ETCO2 Tube secured with: Tape Dental Injury: Teeth and Oropharynx as per pre-operative assessment

## 2016-11-12 NOTE — H&P (Signed)
Patricia Hammond is an 21 y.o. female.   Chief Complaint: left wrist mass HPI: Patient presents for evaluation and treatment of the of their upper extremity predicament. The patient denies neck, back, chest or  abdominal pain. The patient notes that they have no lower extremity problems. The patients primary complaint is noted. We are planning surgical care pathway for the upper extremity.  Past Medical History:  Diagnosis Date  . ADHD (attention deficit hyperactivity disorder)   . Mass of left wrist 10/2016  . Nasal congestion 11/05/2016    Past Surgical History:  Procedure Laterality Date  . TYMPANOSTOMY TUBE PLACEMENT Bilateral   . WISDOM TOOTH EXTRACTION      Family History  Problem Relation Age of Onset  . Hypertension Maternal Aunt   . Hypertension Paternal Aunt   . Diabetes Maternal Grandfather   . Heart disease Maternal Grandfather   . Heart disease Paternal Grandfather    Social History:  reports that she has never smoked. She has never used smokeless tobacco. She reports that she drinks alcohol. She reports that she does not use drugs.  Allergies: No Known Allergies  Medications Prior to Admission  Medication Sig Dispense Refill  . ALPRAZolam (XANAX) 0.5 MG tablet Take 1 tablet PO 1 hr prior to procedure and then 1 tablet PO upon arrival of procedure 2 tablet 0  . levonorgestrel (MIRENA) 20 MCG/24HR IUD 1 each by Intrauterine route once.    . misoprostol (CYTOTEC) 200 MCG tablet Place 2 tablets intravaginally the night prior to procedure 2 tablet 0    No results found for this or any previous visit (from the past 48 hour(s)). No results found.  ROS  Blood pressure 116/75, pulse 74, temperature 97.7 F (36.5 C), temperature source Oral, resp. rate 16, height 5\' 9"  (1.753 m), weight 68.2 kg (150 lb 6.4 oz), last menstrual period 10/29/2016, SpO2 100 %. Physical Exam  Left wrist  Mass painful The patient is alert and oriented in no acute distress. The patient  complains of pain in the affected upper extremity.  The patient is noted to have a normal HEENT exam. Lung fields show equal chest expansion and no shortness of breath. Abdomen exam is nontender without distention. Lower extremity examination does not show any fracture dislocation or blood clot symptoms. Pelvis is stable and the neck and back are stable and nontender. Assessment/Plan We are planning surgery for your upper extremity. The risk and benefits of surgery to include risk of bleeding, infection, anesthesia,  damage to normal structures and failure of the surgery to accomplish its intended goals of relieving symptoms and restoring function have been discussed in detail. With this in mind we plan to proceed. I have specifically discussed with the patient the pre-and postoperative regime and the dos and don'ts and risk and benefits in great detail. Risk and benefits of surgery also include risk of dystrophy(CRPS), chronic nerve pain, failure of the healing process to go onto completion and other inherent risks of surgery The relavent the pathophysiology of the disease/injury process, as well as the alternatives for treatment and postoperative course of action has been discussed in great detail with the patient who desires to proceed.  We will do everything in our power to help you (the patient) restore function to the upper extremity. It is a pleasure to see this patient today.   Plan left wrist mass removal  Paulene Floor, MD 11/12/2016, 7:31 AM

## 2016-11-12 NOTE — Anesthesia Postprocedure Evaluation (Signed)
Anesthesia Post Note  Patient: Patricia Hammond  Procedure(s) Performed: Procedure(s) (LRB): Left wrist mass excision with arthrotomy synovectomy and posterior interosseous nerve neurectomy (Left) SYNOVECTOMY (Left)  Patient location during evaluation: PACU Anesthesia Type: General Level of consciousness: awake Pain management: pain level controlled Respiratory status: spontaneous breathing Cardiovascular status: stable Anesthetic complications: no       Last Vitals:  Vitals:   11/12/16 0855 11/12/16 0900  BP:  108/86  Pulse: 76 65  Resp: 15 14  Temp:  36.6 C    Last Pain:  Vitals:   11/12/16 0900  TempSrc:   PainSc: 5                  Patricia Hammond

## 2016-11-12 NOTE — Transfer of Care (Signed)
Immediate Anesthesia Transfer of Care Note  Patient: Patricia Hammond  Procedure(s) Performed: Procedure(s): Left wrist mass excision with arthrotomy synovectomy and posterior interosseous nerve neurectomy (Left) SYNOVECTOMY (Left)  Patient Location: PACU  Anesthesia Type:General  Level of Consciousness: sedated  Airway & Oxygen Therapy: Patient Spontanous Breathing and Patient connected to face mask oxygen  Post-op Assessment: Report given to RN and Post -op Vital signs reviewed and stable  Post vital signs: Reviewed and stable  Last Vitals:  Vitals:   11/12/16 0635  BP: 116/75  Pulse: 74  Resp: 16  Temp: 36.5 C    Last Pain:  Vitals:   11/12/16 0635  TempSrc: Oral         Complications: No apparent anesthesia complications

## 2016-11-14 NOTE — Op Note (Signed)
NAME:  Patricia Hammond, Patricia Hammond                  ACCOUNT NO.:  MEDICAL RECORD NO.:  [id number]  LOCATION:                                 FACILITY:  PHYSICIAN:  Satira Anis. Timmey Lamba, M.D.     DATE OF BIRTH:  DATE OF PROCEDURE:11/12/2016 DATE OF DISCHARGE:11/12/2016                              OPERATIVE REPORT   PREOPERATIVE DIAGNOSIS:  Left dorsal wrist mass, painful in nature.  POSTOPERATIVE DIAGNOSIS:  Left dorsal wrist mass, painful in nature.  PROCEDURE: 1. Left dorsal wrist mass excision, greater than 3 cm, subfascial in     location. 2. Arthrotomy synovectomy, left wrist. 3. Posterior interosseous nerve neurectomy, left wrist.  SURGEON:  Satira Anis. Amedeo Plenty, MD.  ASSISTANT:  None.  COMPLICATIONS:  None.  SPECIMENS:  None.  INDICATIONS:  The patient is pleasant female who presents with the above- mentioned diagnosis.  I have counseled regarding risks and benefits and surgery and she desires to proceed.  OPERATIVE PROCEDURE:  The patient was seen by myself and Anesthesia, taken to the operative theater, and underwent smooth induction of general anesthetic.  Left upper extremity was identified.  Time-out was called.  She was pre-scrubbed with Hibiclens by myself, followed by 10 minutes surgical Betadine scrub and paint.  Outline marks were made. Final time-out secured.  Preoperative antibiotics were given and the patient underwent a transverse incision on the dorsal aspect of wrist.  Dissection was carried down very carefully and cautiously, identified the circumferential greater than 3 cm mass, made an interval between the ECRD and the extensor apparatus (EDC).  I identified the EPL, kept this free from harm's way, and preserved the tendinous structures at all times.  I circumferentially dissected the mass, performed an arthrotomy, synovectomy, and removed the stalk origin of the mass from the scapholunate interosseous ligament.  We treated the fiber mucinous degenerative  changes of the scapholunate ligament where the stalk was emanating from. I then stress test the interval, made sure that she was competent, and she was.  I then performed additional synovectomy of the radiocarpal joint without difficulty.  Following this, identified the posterior interosseous nerve and resected a 2.5 cm branch of it followed by crushing, cauterization, and bearing procedure proximally.  Thus, PI neurectomy, arthrotomy, synovectomy, and mass removal were accomplished.  I irrigated copiously, deflated the tourniquet, and closed the wound with subcuticular Prolene over Steri-Strips.  The patient tolerated this well.  She was placed in a splint.  She tolerated the procedure nicely.  I will see her back in 12 days and remove her sutures.  We will hold her still and begin therapy for aggressive range of motion at 18 days postop.  These notes have been discussed and all questions have been encouraged and answered.  Should any problems occur, I will be immediately available.     Satira Anis. Amedeo Plenty, M.D.     [facility]  D:  11/12/2016  T:  11/13/2016  Job:  [id number]

## 2016-11-15 ENCOUNTER — Encounter (HOSPITAL_BASED_OUTPATIENT_CLINIC_OR_DEPARTMENT_OTHER): Payer: Self-pay | Admitting: Orthopedic Surgery

## 2016-11-25 MED FILL — miSOPROStol 200 MCG TABS: 200 | 1 days supply | Qty: 2 | Fill #0

## 2016-11-25 MED FILL — ALPRAZolam 0.5 MG TABS: 0.5 | 1 days supply | Qty: 2 | Fill #0

## 2016-12-02 ENCOUNTER — Encounter: Payer: Self-pay | Admitting: Obstetrics & Gynecology

## 2016-12-02 ENCOUNTER — Ambulatory Visit (INDEPENDENT_AMBULATORY_CARE_PROVIDER_SITE_OTHER): Payer: 59 | Admitting: Obstetrics & Gynecology

## 2016-12-02 VITALS — BP 115/76 | HR 76 | Ht 69.0 in | Wt 149.0 lb

## 2016-12-02 DIAGNOSIS — Z202 Contact with and (suspected) exposure to infections with a predominantly sexual mode of transmission: Secondary | ICD-10-CM

## 2016-12-02 DIAGNOSIS — Z30433 Encounter for removal and reinsertion of intrauterine contraceptive device: Secondary | ICD-10-CM | POA: Diagnosis not present

## 2016-12-02 DIAGNOSIS — Z113 Encounter for screening for infections with a predominantly sexual mode of transmission: Secondary | ICD-10-CM

## 2016-12-02 DIAGNOSIS — Z3202 Encounter for pregnancy test, result negative: Secondary | ICD-10-CM | POA: Diagnosis not present

## 2016-12-02 DIAGNOSIS — Z309 Encounter for contraceptive management, unspecified: Secondary | ICD-10-CM

## 2016-12-02 LAB — POCT URINE PREGNANCY: PREG TEST UR: NEGATIVE

## 2016-12-02 MED ORDER — LEVONORGESTREL 20 MCG/24HR IU IUD
INTRAUTERINE_SYSTEM | Freq: Once | INTRAUTERINE | Status: AC
  Administered 2016-12-02: 14:00:00 via INTRAUTERINE

## 2016-12-02 NOTE — Progress Notes (Signed)
   Subjective:    Patient ID: Patricia Hammond, female    DOB: November 01, 1995, 21 y.o.   MRN: [id number]  HPI 21 yo SW G0 here for removal of her newly expiring Mirena and replacement with a new one. She has light monthly 5 day periods.  She would like STI testing.  Review of Systems She declines a flu vaccine.    Objective:   Physical Exam [id number] Breathing, conversing, and ambulating normally Her strings were not visible but I was able to retrieve them with the help of the IUD hook.  I then removed the intact Mirena easily. UPT negative, consent signed, Time out procedure done. Cervix prepped with betadine and grasped with a single tooth tenaculum. Mirena was easily placed and the strings were cut to 3-4 cm. Uterus sounded to 9 cm. She tolerated the procedure well.        Assessment & Plan:  Contraception- Mirena STI testing today per patient request Pap due 2020

## 2016-12-02 NOTE — Progress Notes (Signed)
Wants STI testing

## 2016-12-03 LAB — HEPATITIS B SURFACE ANTIGEN: Hepatitis B Surface Ag: NEGATIVE

## 2016-12-03 LAB — HIV ANTIBODY (ROUTINE TESTING W REFLEX): HIV 1&2 Ab, 4th Generation: NONREACTIVE

## 2016-12-03 LAB — RPR

## 2016-12-03 LAB — HEPATITIS C ANTIBODY: HCV AB: NEGATIVE

## 2016-12-06 LAB — GC/CHLAMYDIA PROBE AMP (~~LOC~~) NOT AT ARMC
Chlamydia: NEGATIVE
Neisseria Gonorrhea: NEGATIVE

## 2017-08-23 ENCOUNTER — Ambulatory Visit (INDEPENDENT_AMBULATORY_CARE_PROVIDER_SITE_OTHER): Payer: 59 | Admitting: Obstetrics and Gynecology

## 2017-08-23 ENCOUNTER — Encounter: Payer: Self-pay | Admitting: Obstetrics and Gynecology

## 2017-08-23 VITALS — BP 126/85 | HR 63 | Ht 69.0 in | Wt 147.0 lb

## 2017-08-23 DIAGNOSIS — Z113 Encounter for screening for infections with a predominantly sexual mode of transmission: Secondary | ICD-10-CM

## 2017-08-23 DIAGNOSIS — Z30431 Encounter for routine checking of intrauterine contraceptive device: Secondary | ICD-10-CM | POA: Diagnosis not present

## 2017-08-23 DIAGNOSIS — Z202 Contact with and (suspected) exposure to infections with a predominantly sexual mode of transmission: Secondary | ICD-10-CM

## 2017-08-23 NOTE — Progress Notes (Signed)
21 yo G0 here for IUD check and evaluation of prolonged period. Patient had IUD inserted in 11/2016. She reports a monthly period lasting 5 days typically. However this past month seh experienced 3 weeks of vaginal bleeding with the first and last week consisting of vaginal spotting. Patient reports this bleeding episode being associated with severe cramping. She is not currently sexually active but has been and requested STD screen. She denies any abnormal discharge  Past Medical History:  Diagnosis Date  . ADHD (attention deficit hyperactivity disorder)   . Mass of left wrist 10/2016  . Nasal congestion 11/05/2016   Past Surgical History:  Procedure Laterality Date  . TYMPANOSTOMY TUBE PLACEMENT Bilateral   . WISDOM TOOTH EXTRACTION     Family History  Problem Relation Age of Onset  . Hypertension Maternal Aunt   . Hypertension Paternal Aunt   . Diabetes Maternal Grandfather   . Heart disease Maternal Grandfather   . Heart disease Paternal Grandfather    Social History   Tobacco Use  . Smoking status: Never Smoker  . Smokeless tobacco: Never Used  Substance Use Topics  . Alcohol use: Yes    Comment: 1 drink/day  . Drug use: No   ROS See pertinent in HPI  Blood pressure 126/85, pulse 63, height 5\' 9"  (1.753 m), weight 147 lb (66.7 kg), last menstrual period 08/02/2017. GENERAL: Well-developed, well-nourished female in no acute distress.  ABDOMEN: Soft, nontender, nondistended.  PELVIC: Normal external female genitalia. Vagina is pink and rugated.  Normal discharge. Normal appearing cervix with IUD strings extending 2 cm from os. Uterus is normal in size. No adnexal mass or tenderness. EXTREMITIES: No cyanosis, clubbing, or edema, 2+ distal pulses.  A/P 21 yo with one time episode of DUB - STD screen performed - patient advised to keep a menstrual calendar - Patient advised to change Diva cup which she has been using for a year - IUD appears to be in the appropriate  location - Patient to return for ultrasound if no improvement in vaginal bleeding - Patient will be contacted with abnormal results

## 2017-08-23 NOTE — Progress Notes (Signed)
Pt c/o 3 week long period that is heavy with painful cramps

## 2017-08-24 LAB — HEPATITIS B SURFACE ANTIGEN: Hepatitis B Surface Ag: NONREACTIVE

## 2017-08-24 LAB — RPR: RPR Ser Ql: NONREACTIVE

## 2017-08-24 LAB — HIV ANTIBODY (ROUTINE TESTING W REFLEX): HIV: NONREACTIVE

## 2017-08-24 LAB — HEPATITIS C ANTIBODY
Hepatitis C Ab: NONREACTIVE
SIGNAL TO CUT-OFF: 0.01 (ref <1.00)

## 2017-08-25 ENCOUNTER — Other Ambulatory Visit: Payer: Self-pay | Admitting: Obstetrics and Gynecology

## 2017-08-25 LAB — GC/CHLAMYDIA PROBE AMP (~~LOC~~) NOT AT ARMC
Chlamydia: POSITIVE — AB
NEISSERIA GONORRHEA: NEGATIVE

## 2017-08-25 MED ORDER — AZITHROMYCIN 500 MG PO TABS: 1000.0000 mg | ORAL_TABLET | Freq: Once | ORAL | 1 refills | Status: AC

## 2017-08-26 ENCOUNTER — Telehealth: Payer: Self-pay | Admitting: *Deleted

## 2017-08-26 MED ORDER — AZITHROMYCIN 250 MG PO TABS: 250.0000 mg | ORAL_TABLET | Freq: Once | ORAL | 0 refills | Status: AC

## 2017-08-26 MED FILL — AZITHROMYCIN 250 MG TABLET: 250 | 1 days supply | Qty: 4 | Fill #0

## 2017-08-26 NOTE — Telephone Encounter (Signed)
Pt notified of positive Chlamydia infection.  Per Dr Elly Modena Azithromycin was sent to [facility].  Pt made aware that her partner also needs to be treated and to abstain from intercourse at least 7 days after treatment.  Pt did test neg for Hep B and C,HIV and RPR

## 2017-08-26 NOTE — Telephone Encounter (Signed)
-----   Message from Mora Bellman, MD sent at 08/25/2017  4:59 PM EST ----- Please inform patient of chlamydia infection. Rx azithromycin has been e-prescribed. Her partner needs to be informed and treated as well. They should both abstain for 7 days until completion of treatment.  Given that patient screened positive for an STD, I would recommend that she obtains full STD testing  Thanks  Vickii Chafe

## 2017-11-14 ENCOUNTER — Other Ambulatory Visit (INDEPENDENT_AMBULATORY_CARE_PROVIDER_SITE_OTHER): Payer: 59

## 2017-11-14 DIAGNOSIS — Z113 Encounter for screening for infections with a predominantly sexual mode of transmission: Secondary | ICD-10-CM

## 2017-11-14 DIAGNOSIS — Z202 Contact with and (suspected) exposure to infections with a predominantly sexual mode of transmission: Secondary | ICD-10-CM

## 2017-11-14 NOTE — Addendum Note (Signed)
Addended by: Lyndal Rainbow on: 11/14/2017 01:41 PM   Modules accepted: Orders

## 2017-11-15 LAB — HEPATITIS C ANTIBODY
Hepatitis C Ab: NONREACTIVE
SIGNAL TO CUT-OFF: 0.01 (ref <1.00)

## 2017-11-15 LAB — RPR: RPR: NONREACTIVE

## 2017-11-15 LAB — HIV ANTIBODY (ROUTINE TESTING W REFLEX): HIV: NONREACTIVE

## 2017-11-15 LAB — GC/CHLAMYDIA PROBE AMP (~~LOC~~) NOT AT ARMC
Chlamydia: NEGATIVE
Neisseria Gonorrhea: NEGATIVE

## 2017-11-15 LAB — HEPATITIS B SURFACE ANTIGEN: Hepatitis B Surface Ag: NONREACTIVE

## 2017-11-16 ENCOUNTER — Telehealth: Payer: Self-pay

## 2017-11-16 NOTE — Telephone Encounter (Signed)
Spoke with pt and she is aware of negative STI testing. Pt wanted these results in writing so we put the results in an envelope and said she could come to office to pick it up. Pt expressed understanding.

## 2018-04-24 ENCOUNTER — Ambulatory Visit (INDEPENDENT_AMBULATORY_CARE_PROVIDER_SITE_OTHER): Payer: 59 | Admitting: Physician Assistant

## 2018-04-24 ENCOUNTER — Encounter: Payer: Self-pay | Admitting: Physician Assistant

## 2018-04-24 VITALS — BP 114/81 | HR 62 | Ht 69.02 in | Wt 148.0 lb

## 2018-04-24 DIAGNOSIS — H6123 Impacted cerumen, bilateral: Secondary | ICD-10-CM

## 2018-04-24 MED ORDER — CARBAMIDE PEROXIDE 6.5 % OT SOLN: 10.0000 [drp] | Freq: Two times a day (BID) | OTIC | 0 refills | Status: DC

## 2018-04-24 MED FILL — DEBROX 6.5 % SOLN: 6.5 | 10 days supply | Qty: 15 | Fill #0

## 2018-04-24 NOTE — Progress Notes (Signed)
   Subjective:    Patient ID: Patricia Hammond, female    DOB: 1996-01-23, 22 y.o.   MRN: [id number]  HPI  Pt is a 22 yo female who presents to the clinic with bilateral ear drainage and fullness sensation. She denies any pain. Left is worse than right. She does see some clear to dark discharge but no blood. She admits to sleeping with ear buds in about 2 months when went on vacation with family. No fever, chills, body aches. No sinus pressure, ST, cough. No dizziness or headaches.  She has tried to clean them out with no benefit.   .. Active Ambulatory Problems    Diagnosis Date Noted  . ADHD (attention deficit hyperactivity disorder) 06/23/2012  . IUD threads lost 11/07/2013  . Ganglion cyst of wrist 12/19/2013  . RUQ discomfort 12/11/2014  . Pain in the chest 12/11/2014  . Chest tightness 12/11/2014   Resolved Ambulatory Problems    Diagnosis Date Noted  . Abnormality of gait 09/24/2009  . ABDOMINAL PAIN 03/04/2011   Past Medical History:  Diagnosis Date  . ADHD (attention deficit hyperactivity disorder)   . Mass of left wrist 10/2016  . Nasal congestion 11/05/2016      Review of Systems  All other systems reviewed and are negative.      Objective:   Physical Exam  Constitutional: She is oriented to person, place, and time. She appears well-developed and well-nourished.  HENT:  Head: Normocephalic and atraumatic.  Right TM parital occluded due to cerumen.  Left TM no visualized due to cerumen in external canal.  No tenderness over tragus.   After irrigation:  Right TM still partially visualized.  Left TM signification amt of cerumen and TM clear.   Neurological: She is alert and oriented to person, place, and time.  Psychiatric: She has a normal mood and affect. Her behavior is normal.          Assessment & Plan:  Marland KitchenMarland KitchenAmber was seen today for ear pain.  Diagnoses and all orders for this visit:  Bilateral impacted cerumen -     carbamide peroxide (DEBROX)  6.5 % OTIC solution; Place 10 drops into both ears 2 (two) times daily. Dispense 1 bottle   .Marland KitchenIndication: Cerumen impaction of the ear(s)  Medical necessity statement: On physical examination, cerumen impairs clinically significant portions of the external auditory canal, and tympanic membrane. Noted obstructive, copious cerumen that cannot be removed without magnification and instrumentations requiring physician skills Consent: Discussed benefits and risks of procedure and verbal consent obtained Procedure: Patient was prepped for the procedure. Utilized an otoscope to assess and take note of the ear canal, the tympanic membrane, and the presence, amount, and placement of the cerumen. Gentle water irrigation and soft plastic curette was utilized to remove cerumen.  Post procedure examination: shows cerumen was completely removed. Patient tolerated procedure well. The patient is made aware that they may experience temporary vertigo, temporary hearing loss, and temporary discomfort. If these symptom last for more than 24 hours to call the clinic or proceed to the ED.  Still some residual cerumen present. Discussed debrox and then follow up if she feels congestion/drainage/hearing loss and we can irrigate again.

## 2018-04-24 NOTE — Patient Instructions (Signed)
Earwax Buildup, Adult The ears produce a substance called earwax that helps keep bacteria out of the ear and protects the skin in the ear canal. Occasionally, earwax can build up in the ear and cause discomfort or hearing loss. What increases the risk? This condition is more likely to develop in people who:  Are female.  Are elderly.  Naturally produce more earwax.  Clean their ears often with cotton swabs.  Use earplugs often.  Use in-ear headphones often.  Wear hearing aids.  Have narrow ear canals.  Have earwax that is overly thick or sticky.  Have eczema.  Are dehydrated.  Have excess hair in the ear canal.  What are the signs or symptoms? Symptoms of this condition include:  Reduced or muffled hearing.  A feeling of fullness in the ear or feeling that the ear is plugged.  Fluid coming from the ear.  Ear pain.  Ear itch.  Ringing in the ear.  Coughing.  An obvious piece of earwax that can be seen inside the ear canal.  How is this diagnosed? This condition may be diagnosed based on:  Your symptoms.  Your medical history.  An ear exam. During the exam, your health care provider will look into your ear with an instrument called an otoscope.  You may have tests, including a hearing test. How is this treated? This condition may be treated by:  Using ear drops to soften the earwax.  Having the earwax removed by a health care provider. The health care provider may: ? Flush the ear with water. ? Use an instrument that has a loop on the end (curette). ? Use a suction device.  Surgery to remove the wax buildup. This may be done in severe cases.  Follow these instructions at home:  Take over-the-counter and prescription medicines only as told by your health care provider.  Do not put any objects, including cotton swabs, into your ear. You can clean the opening of your ear canal with a washcloth or facial tissue.  Follow instructions from your health  care provider about cleaning your ears. Do not over-clean your ears.  Drink enough fluid to keep your urine clear or pale yellow. This will help to thin the earwax.  Keep all follow-up visits as told by your health care provider. If earwax builds up in your ears often or if you use hearing aids, consider seeing your health care provider for routine, preventive ear cleanings. Ask your health care provider how often you should schedule your cleanings.  If you have hearing aids, clean them according to instructions from the manufacturer and your health care provider. Contact a health care provider if:  You have ear pain.  You develop a fever.  You have blood, pus, or other fluid coming from your ear.  You have hearing loss.  You have ringing in your ears that does not go away.  Your symptoms do not improve with treatment.  You feel like the room is spinning (vertigo). Summary  Earwax can build up in the ear and cause discomfort or hearing loss.  The most common symptoms of this condition include reduced or muffled hearing and a feeling of fullness in the ear or feeling that the ear is plugged.  This condition may be diagnosed based on your symptoms, your medical history, and an ear exam.  This condition may be treated by using ear drops to soften the earwax or by having the earwax removed by a health care provider.  Do   not put any objects, including cotton swabs, into your ear. You can clean the opening of your ear canal with a washcloth or facial tissue. This information is not intended to replace advice given to you by your health care provider. Make sure you discuss any questions you have with your health care provider. Document Released: 11/11/2004 Document Revised: 12/15/2016 Document Reviewed: 12/15/2016 Elsevier Interactive Patient Education  2018 [employer]  

## 2018-04-25 ENCOUNTER — Telehealth: Payer: Self-pay

## 2018-04-25 NOTE — Telephone Encounter (Signed)
Likely debrox is getting trapped behind the hardening wax and causing some conductive hearing loss(just means water is blocking hearing channel). It is not hurting you but I would like to see [place] be able to get that wax out of your right ear. Maybe try it one more time and then come in to let us irrigate once more(nurse visit). Ok to stop if don't feel comfortable but not hurting your ear but hopefully causing some of the wax to loosen and come out soon.

## 2018-04-25 NOTE — Telephone Encounter (Signed)
Pt left voicemail stating that she put debrox in her ears this morning. She stated that she put 5 in her right and 7 in left. She states that her right ear is stopped up and cant hear out of it and some of the medicine is still draining out.  Her left ear is fine. She stated that she could hear fine out of both ears before putting the debrox into her ears. Please advise on next steps. -[place]

## 2018-04-25 NOTE — Telephone Encounter (Signed)
Vm left with detailed messge. -[place]

## 2018-04-26 ENCOUNTER — Ambulatory Visit (INDEPENDENT_AMBULATORY_CARE_PROVIDER_SITE_OTHER): Payer: 59 | Admitting: Physician Assistant

## 2018-04-26 ENCOUNTER — Encounter: Payer: Self-pay | Admitting: Physician Assistant

## 2018-04-26 VITALS — BP 120/77 | HR 64 | Ht 69.02 in | Wt 148.0 lb

## 2018-04-26 DIAGNOSIS — H60311 Diffuse otitis externa, right ear: Secondary | ICD-10-CM

## 2018-04-26 DIAGNOSIS — H6981 Other specified disorders of Eustachian tube, right ear: Secondary | ICD-10-CM | POA: Diagnosis not present

## 2018-04-26 MED ORDER — FLUTICASONE PROPIONATE 50 MCG/ACT NA SUSP: 2.0000 | Freq: Every day | NASAL | 1 refills | Status: DC

## 2018-04-26 MED ORDER — OFLOXACIN 0.3 % OT SOLN: 10.0000 [drp] | Freq: Every day | OTIC | 0 refills | Status: DC

## 2018-04-26 MED ORDER — METHYLPREDNISOLONE 4 MG PO TBPK
ORAL_TABLET | ORAL | 0 refills | Status: DC
Start: 2018-04-26 — End: 2024-04-05

## 2018-04-26 MED FILL — OFLOXACIN 0.3% EAR DROPS: 0.3 | 10 days supply | Qty: 5 | Fill #0

## 2018-04-26 MED FILL — FLUTICASONE PROP 50 MCG SPR: 50 | 30 days supply | Qty: 16 | Fill #0

## 2018-04-26 NOTE — Patient Instructions (Signed)
Otitis Externa Otitis externa is an infection of the outer ear canal. The outer ear canal is the area between the outside of the ear and the eardrum. Otitis externa is sometimes called "swimmer's ear." Follow these instructions at home:  If you were given antibiotic ear drops, use them as told by your doctor. Do not stop using them even if your condition gets better.  Take over-the-counter and prescription medicines only as told by your doctor.  Keep all follow-up visits as told by your doctor. This is important. How is this prevented?  Keep your ear dry. Use the corner of a towel to dry your ear after you swim or bathe.  Try not to scratch or put things in your ear. Doing these things makes it easier for germs to grow in your ear.  Avoid swimming in lakes, dirty water, or pools that may not have the right amount of a chemical called chlorine.  Consider making ear drops and putting 3 or 4 drops in each ear after you swim. Ask your doctor about how you can make ear drops. Contact a doctor if:  You have a fever.  After 3 days your ear is still red, swollen, or painful.  After 3 days you still have pus coming from your ear.  Your redness, swelling, or pain gets worse.  You have a really bad headache.  You have redness, swelling, pain, or tenderness behind your ear. This information is not intended to replace advice given to you by your health care provider. Make sure you discuss any questions you have with your health care provider. Document Released: 03/22/2008 Document Revised: 10/30/2015 Document Reviewed: 07/14/2015 Elsevier Interactive Patient Education  2018 [employer]. Eustachian Tube Dysfunction The eustachian tube connects the middle ear to the back of the nose. It regulates air pressure in the middle ear by allowing air to move between the ear and nose. It also helps to drain fluid from the middle ear space. When the eustachian tube does not function properly, air  pressure, fluid, or both can build up in the middle ear. Eustachian tube dysfunction can affect one or both ears. What are the causes? This condition happens when the eustachian tube becomes blocked or cannot open normally. This may result from:  Ear infections.  Colds and other upper respiratory infections.  Allergies.  Irritation, such as from cigarette smoke or acid from the stomach coming up into the esophagus (gastroesophageal reflux).  Sudden changes in air pressure, such as from descending in an airplane.  Abnormal growths in the nose or throat, such as nasal polyps, tumors, or enlarged tissue at the back of the throat (adenoids).  What increases the risk? This condition may be more likely to develop in people who smoke and people who are overweight. Eustachian tube dysfunction may also be more likely to develop in children, especially children who have:  Certain birth defects of the mouth, such as cleft palate.  Large tonsils and adenoids.  What are the signs or symptoms? Symptoms of this condition may include:  A feeling of fullness in the ear.  Ear pain.  Clicking or popping noises in the ear.  Ringing in the ear.  Hearing loss.  Loss of balance.  Symptoms may get worse when the air pressure around you changes, such as when you travel to an area of high elevation or fly on an airplane. How is this diagnosed? This condition may be diagnosed based on:  Your symptoms.  A physical exam of  your ear, nose, and throat.  Tests, such as those that measure: ? The movement of your eardrum (tympanogram). ? Your hearing (audiometry).  How is this treated? Treatment depends on the cause and severity of your condition. If your symptoms are mild, you may be able to relieve your symptoms by moving air into ("popping") your ears. If you have symptoms of fluid in your ears, treatment may include:  Decongestants.  Antihistamines.  Nasal sprays or ear drops that contain  medicines that reduce swelling (steroids).  In some cases, you may need to have a procedure to drain the fluid in your eardrum (myringotomy). In this procedure, a small tube is placed in the eardrum to:  Drain the fluid.  Restore the air in the middle ear space.  Follow these instructions at home:  Take over-the-counter and prescription medicines only as told by your health care provider.  Use techniques to help pop your ears as recommended by your health care provider. These may include: ? Chewing gum. ? Yawning. ? Frequent, forceful swallowing. ? Closing your mouth, holding your nose closed, and gently blowing as if you are trying to blow air out of your nose.  Do not do any of the following until your health care provider approves: ? Travel to high altitudes. ? Fly in airplanes. ? Work in a Pension scheme manager or room. ? Scuba dive.  Keep your ears dry. Dry your ears completely after showering or bathing.  Do not smoke.  Keep all follow-up visits as told by your health care provider. This is important. Contact a health care provider if:  Your symptoms do not go away after treatment.  Your symptoms come back after treatment.  You are unable to pop your ears.  You have: ? A fever. ? Pain in your ear. ? Pain in your head or neck. ? Fluid draining from your ear.  Your hearing suddenly changes.  You become very dizzy.  You lose your balance. This information is not intended to replace advice given to you by your health care provider. Make sure you discuss any questions you have with your health care provider. Document Released: 10/31/2015 Document Revised: 03/11/2016 Document Reviewed: 10/23/2014 Elsevier Interactive Patient Education  [employer].

## 2018-04-26 NOTE — Progress Notes (Signed)
   Subjective:    Patient ID: Patricia Hammond, female    DOB: 1996/05/07, 22 y.o.   MRN: [id number]  HPI Patient is a 22 year old female who presents with persistent right ear pressure and pain since ear irrigation on 04/24/2018.  She was seen in the clinic for bilateral cerumen impaction.  Bilateral ear irrigation was done.  Her left ear she denies any complaints and can hear great out of.  Her right ear feels clogged.  She is using the Debrox drops for the past few days without any benefit.  She is now getting some sharp pain in her right ear.  She denies any fever, chills, sinus pressure.  She does have some ear popping.  She does admit to swimming this week.  .. Active Ambulatory Problems    Diagnosis Date Noted  . ADHD (attention deficit hyperactivity disorder) 06/23/2012  . IUD threads lost 11/07/2013  . Ganglion cyst of wrist 12/19/2013  . RUQ discomfort 12/11/2014  . Pain in the chest 12/11/2014  . Chest tightness 12/11/2014   Resolved Ambulatory Problems    Diagnosis Date Noted  . Abnormality of gait 09/24/2009  . ABDOMINAL PAIN 03/04/2011   Past Medical History:  Diagnosis Date  . ADHD (attention deficit hyperactivity disorder)   . Mass of left wrist 10/2016  . Nasal congestion 11/05/2016      Review of Systems  All other systems reviewed and are negative.      Objective:   Physical Exam  Constitutional: She is oriented to person, place, and time. She appears well-developed and well-nourished.  HENT:  Head: Normocephalic and atraumatic.  Left TM has some residual cerumen in the canal but is not obstructing the clear TM.  Hearing intact.  Right TM there are more significant amount of cerumen with white debris and erythema in the external canal.  Hearing intact.  No tenderness over tragus  Neurological: She is alert and oriented to person, place, and time.          Assessment & Plan:  Marland KitchenMarland KitchenAmber was seen today for ear irrigation.  Diagnoses and all orders  for this visit:  Acute diffuse otitis externa of right ear -     ofloxacin (FLOXIN) 0.3 % OTIC solution; Place 10 drops into the right ear daily. For 7 days.  ETD (Eustachian tube dysfunction), right -     fluticasone (FLONASE) 50 MCG/ACT nasal spray; Place 2 sprays into both nostrils daily. -     methylPREDNISolone (MEDROL DOSEPAK) 4 MG TBPK tablet; Take as directed by package insert.   I do not see any significant cerumen impaction today.  I do see signs of otitis externa in the right ear.  We will treat with ofloxacin for 7 days.  Signs and symptoms that seem to be consistent with eustachian tube dysfunction of right ear.  Start with Flonase for 2 to 3 days and if not improving may add Medrol Dosepak for eustachian tube dysfunction.  Handout given.  She may occasionally continue to use Debrox to help thin out the wax that accumulates in her ears.  Follow-up as needed.

## 2018-04-27 ENCOUNTER — Ambulatory Visit: Payer: 59

## 2020-10-30 ENCOUNTER — Other Ambulatory Visit: Payer: Self-pay

## 2020-10-30 ENCOUNTER — Other Ambulatory Visit: Payer: 59

## 2020-10-30 DIAGNOSIS — Z20822 Contact with and (suspected) exposure to covid-19: Secondary | ICD-10-CM

## 2020-11-01 LAB — NOVEL CORONAVIRUS, NAA: SARS-CoV-2, NAA: DETECTED — AB

## 2020-11-01 LAB — SARS-COV-2, NAA 2 DAY TAT

## 2024-02-28 ENCOUNTER — Other Ambulatory Visit: Payer: Self-pay

## 2024-02-28 ENCOUNTER — Emergency Department (HOSPITAL_BASED_OUTPATIENT_CLINIC_OR_DEPARTMENT_OTHER)

## 2024-02-28 ENCOUNTER — Other Ambulatory Visit (HOSPITAL_BASED_OUTPATIENT_CLINIC_OR_DEPARTMENT_OTHER): Payer: Self-pay

## 2024-02-28 ENCOUNTER — Emergency Department (HOSPITAL_BASED_OUTPATIENT_CLINIC_OR_DEPARTMENT_OTHER)
Admission: EM | Admit: 2024-02-28 | Discharge: 2024-02-28 | Disposition: A | Discharge status: Home / Self Care | Attending: Emergency Medicine | Admitting: Emergency Medicine

## 2024-02-28 ENCOUNTER — Encounter (HOSPITAL_BASED_OUTPATIENT_CLINIC_OR_DEPARTMENT_OTHER): Payer: Self-pay

## 2024-02-28 DIAGNOSIS — R109 Unspecified abdominal pain: Secondary | ICD-10-CM

## 2024-02-28 DIAGNOSIS — R1013 Epigastric pain: Secondary | ICD-10-CM | POA: Insufficient documentation

## 2024-02-28 LAB — URINALYSIS, ROUTINE W REFLEX MICROSCOPIC
Bilirubin Urine: NEGATIVE
Glucose, UA: NEGATIVE mg/dL
Hgb urine dipstick: NEGATIVE
Ketones, ur: NEGATIVE mg/dL
Leukocytes,Ua: NEGATIVE
Nitrite: NEGATIVE
Protein, ur: NEGATIVE mg/dL
Specific Gravity, Urine: 1.025 (ref 1.005–1.030)
pH: 6.5 (ref 5.0–8.0)

## 2024-02-28 LAB — CBC
HCT: 42.5 % (ref 36.0–46.0)
Hemoglobin: 14.3 g/dL (ref 12.0–15.0)
MCH: 30.5 pg (ref 26.0–34.0)
MCHC: 33.6 g/dL (ref 30.0–36.0)
MCV: 90.6 fL (ref 80.0–100.0)
Platelets: 164 10*3/uL (ref 150–400)
RBC: 4.69 MIL/uL (ref 3.87–5.11)
RDW: 12 % (ref 11.5–15.5)
WBC: 10 10*3/uL (ref 4.0–10.5)
nRBC: 0 % (ref 0.0–0.2)

## 2024-02-28 LAB — COMPREHENSIVE METABOLIC PANEL WITH GFR
ALT: 11 U/L (ref 0–44)
AST: 14 U/L — ABNORMAL LOW (ref 15–41)
Albumin: 4.8 g/dL (ref 3.5–5.0)
Alkaline Phosphatase: 58 U/L (ref 38–126)
Anion gap: 7 (ref 5–15)
BUN: 13 mg/dL (ref 6–20)
CO2: 28 mmol/L (ref 22–32)
Calcium: 9.6 mg/dL (ref 8.9–10.3)
Chloride: 103 mmol/L (ref 98–111)
Creatinine, Ser: 0.72 mg/dL (ref 0.44–1.00)
GFR, Estimated: >60 mL/min (ref >60)
Glucose, Bld: 101 mg/dL — ABNORMAL HIGH (ref 70–99)
Potassium: 4 mmol/L (ref 3.5–5.1)
Sodium: 138 mmol/L (ref 135–145)
Total Bilirubin: 0.5 mg/dL (ref 0.0–1.2)
Total Protein: 7.5 g/dL (ref 6.5–8.1)

## 2024-02-28 LAB — PREGNANCY, URINE: Preg Test, Ur: NEGATIVE

## 2024-02-28 LAB — LIPASE, BLOOD: Lipase: 33 U/L (ref 11–51)

## 2024-02-28 MED ORDER — ONDANSETRON HCL 4 MG/2ML IJ SOLN: 4.0000 mg | Freq: Once | INTRAMUSCULAR | Status: DC

## 2024-02-28 MED ORDER — IOHEXOL 300 MG/ML  SOLN
100.0000 mL | Freq: Once | INTRAMUSCULAR | Status: AC | PRN
  Administered 2024-02-28: 100 mL via INTRAVENOUS

## 2024-02-28 MED ORDER — ONDANSETRON HCL 4 MG/2ML IJ SOLN
4.0000 mg | Freq: Three times a day (TID) | INTRAMUSCULAR | Status: DC | PRN
  Administered 2024-02-28: 4 mg via INTRAVENOUS
  Filled 2024-02-28: qty 2

## 2024-02-28 MED ORDER — LIDOCAINE VISCOUS HCL 2 % MT SOLN
15.0000 mL | Freq: Once | OROMUCOSAL | Status: AC
  Administered 2024-02-28: 15 mL via ORAL
  Filled 2024-02-28: qty 15

## 2024-02-28 MED ORDER — FAMOTIDINE 20 MG PO TABS
20.0000 mg | ORAL_TABLET | Freq: Every day | ORAL | 0 refills | Status: AC
  Filled 2024-02-28: qty 30, 30d supply, fill #0

## 2024-02-28 MED ORDER — FENTANYL CITRATE PF 50 MCG/ML IJ SOSY
50.0000 ug | PREFILLED_SYRINGE | Freq: Once | INTRAMUSCULAR | Status: AC
  Administered 2024-02-28: 50 ug via INTRAVENOUS
  Filled 2024-02-28: qty 1

## 2024-02-28 MED ORDER — ALUM & MAG HYDROXIDE-SIMETH 200-200-20 MG/5ML PO SUSP
30.0000 mL | Freq: Once | ORAL | Status: AC
  Administered 2024-02-28: 30 mL via ORAL
  Filled 2024-02-28: qty 30

## 2024-02-28 MED ORDER — LACTATED RINGERS IV BOLUS
1000.0000 mL | Freq: Once | INTRAVENOUS | Status: AC
  Administered 2024-02-28: 1000 mL via INTRAVENOUS

## 2024-02-28 MED ORDER — DICYCLOMINE HCL 10 MG PO CAPS
10.0000 mg | ORAL_CAPSULE | Freq: Once | ORAL | Status: AC
Start: 2024-02-28 — End: 2024-02-28
  Administered 2024-02-28: 10 mg via ORAL
  Filled 2024-02-28: qty 1

## 2024-02-28 MED ORDER — CYCLOBENZAPRINE HCL 5 MG PO TABS
5.0000 mg | ORAL_TABLET | Freq: Once | ORAL | Status: AC
  Administered 2024-02-28: 5 mg via ORAL
  Filled 2024-02-28: qty 1

## 2024-02-28 MED ORDER — DICYCLOMINE HCL 20 MG PO TABS
20.0000 mg | ORAL_TABLET | Freq: Two times a day (BID) | ORAL | 0 refills | Status: AC
  Filled 2024-02-28: qty 20, 10d supply, fill #0

## 2024-02-28 NOTE — ED Notes (Signed)
 Pt notes intense intermittent stabbing like pain in her abdomen at this time after medication intervention.

## 2024-02-28 NOTE — ED Provider Notes (Signed)
 [facility] Provider Note   CSN: [id number] Arrival date & time: 02/28/24  0746     History  Chief Complaint  Patient presents with   Abdominal Pain    Patricia Hammond is a 28 y.o. female.   Abdominal Pain    28 year old female with medical history significant for ADHD presenting to the emergency department with epigastric abdominal pain.  The patient states that she is having upper abdominal pain radiating to her bilateral ribs.  She states that she woke up with the pain.  She denies any nausea or vomiting.  She denies any dysuria or frequency, just started her menstrual cycle today.  No lower abdominal pain.  She states that yesterday she had 4 alcoholic beverages while playing dungeons and dragons.  No fevers or chills.  No known sick contacts.  And radiates slightly to the back and was not alleviated with 800 mg of ibuprofen prior to arrival.  Home Medications Prior to Admission medications   Medication Sig Start Date End Date Taking? Authorizing Provider  dicyclomine (BENTYL) 20 MG tablet Take 1 tablet (20 mg total) by mouth 2 (two) times daily. 02/28/24  Yes Rosealee Concha, MD  famotidine (PEPCID) 20 MG tablet Take 1 tablet (20 mg total) by mouth daily. 02/28/24  Yes Rosealee Concha, MD  carbamide peroxide (DEBROX) 6.5 % OTIC solution Place 10 drops into both ears 2 (two) times daily. Dispense 1 bottle 04/24/18   Breeback, Jade L, PA-C  fluticasone  (FLONASE ) 50 MCG/ACT nasal spray Place 2 sprays into both nostrils daily. 04/26/18   Breeback, Jade L, PA-C  levonorgestrel  (MIRENA ) 20 MCG/24HR IUD 1 each by Intrauterine route once.    [provider]  methylPREDNISolone  (MEDROL  DOSEPAK) 4 MG TBPK tablet Take as directed by package insert. 04/26/18   Breeback, Jade L, PA-C  ofloxacin  (FLOXIN ) 0.3 % OTIC solution Place 10 drops into the right ear daily. For 7 days. 04/26/18   Breeback, Jade L, PA-C      Allergies    Patient has no  known allergies.    Review of Systems   Review of Systems  Gastrointestinal:  Positive for abdominal pain.    Physical Exam Updated Vital Signs BP (!) 118/90 (BP Location: Right Arm)   Pulse 68   Temp (!) 97.5 F (36.4 C)   Resp 18   Ht 5\' 8"  (1.727 m)   Wt 65.8 kg   LMP 02/28/2024 (Exact Date)   SpO2 100%   BMI 22.05 kg/m  Physical Exam Vitals and nursing note reviewed.  Constitutional:      General: She is not in acute distress.    Appearance: She is well-developed.  HENT:     Head: Normocephalic and atraumatic.  Eyes:     Conjunctiva/sclera: Conjunctivae normal.  Cardiovascular:     Rate and Rhythm: Normal rate and regular rhythm.     Heart sounds: No murmur heard. Pulmonary:     Effort: Pulmonary effort is normal. No respiratory distress.     Breath sounds: Normal breath sounds.  Abdominal:     Palpations: Abdomen is soft.     Tenderness: There is abdominal tenderness in the epigastric area. There is guarding. There is no rebound. Negative signs include Murphy's sign.  Musculoskeletal:        General: No swelling.     Cervical back: Neck supple.  Skin:    General: Skin is warm and dry.     Capillary Refill: Capillary  refill takes less than 2 seconds.  Neurological:     Mental Status: She is alert.  Psychiatric:        Mood and Affect: Mood normal.     ED Results / Procedures / Treatments   Labs (all labs ordered are listed, but only abnormal results are displayed) Labs Reviewed  COMPREHENSIVE METABOLIC PANEL WITH GFR - Abnormal; Notable for the following components:      Result Value   Glucose, Bld 101 (*)    AST 14 (*)    All other components within normal limits  LIPASE, BLOOD  CBC  URINALYSIS, ROUTINE W REFLEX MICROSCOPIC  PREGNANCY, URINE    EKG None  Radiology CT ABDOMEN PELVIS W CONTRAST Result Date: 02/28/2024 CLINICAL DATA:  Pancreatitis, acute, severe EXAM: CT ABDOMEN AND PELVIS WITH CONTRAST TECHNIQUE: Multidetector CT imaging of  the abdomen and pelvis was performed using the standard protocol following bolus administration of intravenous contrast. RADIATION DOSE REDUCTION: This exam was performed according to the departmental dose-optimization program which includes automated exposure control, adjustment of the mA and/or kV according to patient size and/or use of iterative reconstruction technique. CONTRAST:  100mL OMNIPAQUE IOHEXOL 300 MG/ML  SOLN COMPARISON:  None available. FINDINGS: Lower chest: No focal airspace consolidation or pleural effusion. Hepatobiliary: No mass. No radiopaque stones or wall thickening of the gallbladder. No intrahepatic or extrahepatic biliary ductal dilation. The portal veins are patent. Pancreas: No mass or main ductal dilation. No peripancreatic inflammation or fluid collection. Spleen: Normal size. No mass. Adrenals/Urinary Tract: No adrenal masses. No renal mass. No nephrolithiasis or hydronephrosis. The urinary bladder is distended without focal abnormality. Stomach/Bowel: The stomach is decompressed without focal abnormality. No small bowel wall thickening or inflammation. No small bowel obstruction.Normal appendix. Vascular/Lymphatic: No aortic aneurysm. No intraabdominal or pelvic lymphadenopathy. Reproductive: The uterus and ovaries are within normal limits for patient's age. No free pelvic fluid. Other: No pneumoperitoneum, ascites, or mesenteric inflammation. Musculoskeletal: No acute fracture or destructive lesion. Multilevel thoracic osteophytosis. IMPRESSION: No acute intraabdominal or pelvic abnormality. Electronically Signed   By: Rance Burrows M.D.   On: 02/28/2024 10:20    Procedures Procedures    Medications Ordered in ED Medications  ondansetron  (ZOFRAN ) injection 4 mg (4 mg Intravenous Given 02/28/24 0817)  lactated ringers  bolus 1,000 mL (0 mLs Intravenous Stopped 02/28/24 1112)  fentaNYL  (SUBLIMAZE ) injection 50 mcg (50 mcg Intravenous Given 02/28/24 0817)  iohexol (OMNIPAQUE)  300 MG/ML solution 100 mL (100 mLs Intravenous Contrast Given 02/28/24 0919)  alum & mag hydroxide-simeth (MAALOX/MYLANTA) 200-200-20 MG/5ML suspension 30 mL (30 mLs Oral Given 02/28/24 1110)    And  lidocaine  (XYLOCAINE ) 2 % viscous mouth solution 15 mL (15 mLs Oral Given 02/28/24 1110)  cyclobenzaprine (FLEXERIL) tablet 5 mg (5 mg Oral Given 02/28/24 1124)  dicyclomine (BENTYL) capsule 10 mg (10 mg Oral Given 02/28/24 1125)    ED Course/ Medical Decision Making/ A&P                                 Medical Decision Making Amount and/or Complexity of Data Reviewed Labs: ordered. Radiology: ordered.  Risk OTC drugs. Prescription drug management.    28 year old female with medical history significant for ADHD presenting to the emergency department with epigastric abdominal pain.  The patient states that she is having upper abdominal pain radiating to her bilateral ribs.  She states that she woke up with the pain.  She denies  any nausea or vomiting.  She denies any dysuria or frequency, just started her menstrual cycle today.  No lower abdominal pain.  She states that yesterday she had 4 alcoholic beverages while playing dungeons and dragons.  No fevers or chills.  No known sick contacts.  And radiates slightly to the back and was not alleviated with 800 mg of ibuprofen prior to arrival.  On arrival, the patient was afebrile, temperature 97.5, not tachycardic or tachypneic, heart rate 68, RR 18, BP 118/90, saturating high percent on room air.  Physical exam revealed epigastric tenderness to palpation, mild guarding present, negative Murphy sign.  Differential diagnose includes alcohol-induced gastritis, alcohol-induced pancreatitis, gallstone disease, less likely small bowel obstruction, other acute intra-abdominal abnormality.  IV access was obtained and the patient was administered IV phenyl for pain control and an LR bolus for volume resuscitation, Zofran  as needed was ordered for nausea as  needed.  Labs: Urine pregnancy negative, CBC without a leukocytosis or anemia, CMP without electrolyte abnormality, normal renal and liver function, normal T. bili, lipase normal, urinalysis without hematuria or UTI  CT abdomen pelvis:  IMPRESSION:  No acute intraabdominal or pelvic abnormality.   Patient symptoms could be due to gastritis, gastric reflux, peptic ulcer disease, muscle cramping and spasm of the abdominal wall, gas pain.  Patient feeling symptomatically improved following the above interventions in addition to Flexeril and Bentyl.  Tolerating oral intake, requesting food, overall well-appearing and hemodynamically stable.  Stable for discharge and outpatient follow-up with her PCP.  Reassuring workup at this time.  Return precautions provided, all questions answered at time of discharge.  DC Instructions:   Your CT imaging and lab evaluation was overall reassuring.  Your symptoms could be due to gastritis, gastric reflux, peptic ulcer disease or a muscle spasm of your abdominal wall.  We will trial a course of Pepcid and Bentyl and have you follow-up with your primary care provider to ensure resolution.  If you have ongoing symptoms, this could be indicative of a peptic ulcer which would require outpatient referral to a gastroenterologist for consideration for endoscopy to visualize an ulcer.  Final Clinical Impression(s) / ED Diagnoses Final diagnoses:  Epigastric pain  Abdominal wall pain    Rx / DC Orders ED Discharge Orders          Ordered    dicyclomine (BENTYL) 20 MG tablet  2 times daily        02/28/24 1150    famotidine (PEPCID) 20 MG tablet  Daily        02/28/24 1150              Rosealee Concha, MD 02/28/24 1153

## 2024-02-28 NOTE — ED Notes (Signed)
 Tolerating po fluids requesting food

## 2024-02-28 NOTE — ED Notes (Signed)
 ED Provider at bedside.

## 2024-02-28 NOTE — ED Triage Notes (Signed)
 Pt states that she is having abdominal pain in  her upper ribs. States that she woke up with the pain. Denies nausea or vomiting.

## 2024-02-28 NOTE — ED Notes (Signed)
 Patient transported to CT

## 2024-02-28 NOTE — Discharge Instructions (Addendum)
 Your CT imaging and lab evaluation was overall reassuring.  Your symptoms could be due to gastritis, gastric reflux, peptic ulcer disease or a muscle spasm of your abdominal wall.  We will trial a course of Pepcid and Bentyl and have you follow-up with your primary care provider to ensure resolution.  If you have ongoing symptoms, this could be indicative of a peptic ulcer which would require outpatient referral to a gastroenterologist for consideration for endoscopy to visualize an ulcer.

## 2024-02-29 ENCOUNTER — Other Ambulatory Visit (HOSPITAL_BASED_OUTPATIENT_CLINIC_OR_DEPARTMENT_OTHER): Payer: Self-pay

## 2024-03-01 ENCOUNTER — Telehealth: Payer: Self-pay | Admitting: Family Medicine

## 2024-03-01 NOTE — Telephone Encounter (Signed)
 Copied from CRM [id number]. Topic: Appointments - Appointment Scheduling >> Mar 01, 2024  7:43 AM Madelyne Schiff wrote: Patient Patricia Hammond would like to come back as a patient. (Last seen 2019)  please advise

## 2024-03-01 NOTE — Telephone Encounter (Signed)
 Ok to schedule.

## 2024-03-02 NOTE — Telephone Encounter (Signed)
 Pt scheduled June 19th with Dr. Greer Leak

## 2024-03-19 ENCOUNTER — Ambulatory Visit: Payer: Self-pay

## 2024-03-19 NOTE — Telephone Encounter (Signed)
 Chief Complaint: Lump to right groin Symptoms: intermittent sharp pain Frequency: Ongoing x 3 weeks, pain developed first Pertinent Negatives: Patient denies fever, rash, swelling Disposition: [] ED /[] Urgent Care (no appt availability in office) / [x] Appointment(In office/virtual)/ []  [facility]/ [] Home Care/ [] Refused Recommended Disposition /[] [facility]/ []  Follow-up with PCP Additional Notes: Pt reports she is in a harness 8-10 hr days 5 days a week at work. Notes right side groin became sore several weeks ago, notes "lump" developed shortly after pain began. Denies swelling, redness, rash, drainage, fever. NP establish has been scheduled, this RN advised UC due to wait for NP visit. Pt agreeable. This RN educated pt on home care, new-worsening symptoms, when to call back/seek emergent care. Pt verbalized understanding and agrees to plan.    Copied from CRM [id number]. Topic: Clinical - Red Word Triage >> Mar 19, 2024  9:10 AM Blair Bumpers wrote: Red Word that prompted transfer to Nurse Triage: Patient has a new patient appt with Dr. Greer Leak. Wants to see if she can now be seen earlier due to her just finding a lump in her groin area & patient states it's causing pain. Patient stated it doesn't hurt all the time but she wears a harness for her job and wants to make sure it's not causing more issues. Checked with CAL and was told that patient would have to be seen by Dr. Greer Leak first as a new patient but in the meantime, she would have to go to Urgent Care or the ER per Almira Armour at [facility]. Reason for Disposition  Boil > 1/2 inch across (> 12 mm; larger than a marble)  Answer Assessment - Initial Assessment Questions 1. APPEARANCE of BOIL: "What does the boil look like?"      "Looks normal" 2. LOCATION: "Where is the boil located?"      Right groin 3. NUMBER: "How many boils are there?"      One 4. SIZE: "How big is the boil?" (e.g., inches, cm; compare to size of a coin or  other object)     "Size of an olive" 5. ONSET: "When did the boil start?"     X 1 week, pain began first 6. PAIN: "Is there any pain?" If Yes, ask: "How bad is the pain?"   (Scale 1-10; or mild, moderate, severe)     Sharp pain, intermittent 7. FEVER: "Do you have a fever?" If Yes, ask: "What is it, how was it measured, and when did it start?"      None 8. SOURCE: "Have you been around anyone with boils or other Staph infections?" "Have you ever had boils before?"     None 9. OTHER SYMPTOMS: "Do you have any other symptoms?" (e.g., shaking chills, weakness, rash elsewhere on body)     None  Protocols used: Boil (Skin Abscess)-A-AH

## 2024-03-19 NOTE — Telephone Encounter (Signed)
Advised to go to UC

## 2024-03-19 NOTE — Telephone Encounter (Signed)
 FYI. Routing to the covering provider.

## 2024-04-05 ENCOUNTER — Ambulatory Visit (INDEPENDENT_AMBULATORY_CARE_PROVIDER_SITE_OTHER): Admitting: Family Medicine

## 2024-04-05 ENCOUNTER — Ambulatory Visit

## 2024-04-05 ENCOUNTER — Encounter: Payer: Self-pay | Admitting: Family Medicine

## 2024-04-05 VITALS — BP 108/66 | HR 72 | Ht 68.0 in | Wt 143.0 lb

## 2024-04-05 DIAGNOSIS — R1909 Other intra-abdominal and pelvic swelling, mass and lump: Secondary | ICD-10-CM | POA: Diagnosis not present

## 2024-04-05 DIAGNOSIS — N760 Acute vaginitis: Secondary | ICD-10-CM | POA: Diagnosis not present

## 2024-04-05 DIAGNOSIS — R1900 Intra-abdominal and pelvic swelling, mass and lump, unspecified site: Secondary | ICD-10-CM | POA: Diagnosis not present

## 2024-04-05 NOTE — Progress Notes (Signed)
 New Patient Office Visit  Subjective    Patient ID: Patricia Hammond, female    DOB: 30-Dec-1995  Age: 28 y.o. MRN: [id number]  CC:  Chief Complaint  Patient presents with   Establish Care    HPI Patricia Hammond presents to establish care.  2.5 months ago she started having pain in her right inner groin and in the last week she has noticed a lump that is grape sized.  She had originally noticed the lump about 3-1/2 weeks ago and it was more [place]man sized.  She works as a Production designer, theatre/television/film at a [employer] and so she is in a full harness probably 40 hours a week.  She has been trying to shift the harness a little bit to get pressure off that area most of the time it does not bother her but if if there is direct pressure it is uncomfortable she has not had any fevers chills or fatigue.  The only medication she takes is a medicine for reflux and she usually only takes that about 4 days/week.   Outpatient Encounter Medications as of 04/05/2024  Medication Sig   dicyclomine  (BENTYL ) 20 MG tablet Take 1 tablet (20 mg total) by mouth 2 (two) times daily.   famotidine  (PEPCID ) 20 MG tablet Take 1 tablet (20 mg total) by mouth daily.   [DISCONTINUED] carbamide peroxide (DEBROX) 6.5 % OTIC solution Place 10 drops into both ears 2 (two) times daily. Dispense 1 bottle   [DISCONTINUED] fluticasone  (FLONASE ) 50 MCG/ACT nasal spray Place 2 sprays into both nostrils daily.   [DISCONTINUED] levonorgestrel  (MIRENA ) 20 MCG/24HR IUD 1 each by Intrauterine route once.   [DISCONTINUED] methylPREDNISolone  (MEDROL  DOSEPAK) 4 MG TBPK tablet Take as directed by package insert.   [DISCONTINUED] ofloxacin  (FLOXIN ) 0.3 % OTIC solution Place 10 drops into the right ear daily. For 7 days.   No facility-administered encounter medications on file as of 04/05/2024.    Past Medical History:  Diagnosis Date   ADHD (attention deficit hyperactivity disorder)    Mass of left wrist 10/2016   Nasal congestion 11/05/2016     Past Surgical History:  Procedure Laterality Date   EXCISION MASS UPPER EXTREMETIES Left 11/12/2016   Procedure: Left wrist mass excision with arthrotomy synovectomy and posterior interosseous nerve neurectomy;  Surgeon: Ronn Cohn, MD;  Location: [facility];  Service: Orthopedics;  Laterality: Left;   TYMPANOSTOMY TUBE PLACEMENT Bilateral    WISDOM TOOTH EXTRACTION      Family History  Problem Relation Age of Onset   Hypertension Maternal Aunt    Hypertension Paternal Aunt    Diabetes Maternal Grandfather    Heart disease Maternal Grandfather    Heart disease Paternal Grandfather     Social History   Socioeconomic History   Marital status: Single    Spouse name: Not on file   Number of children: Not on file   Years of education: Not on file   Highest education level: Not on file  Occupational History   Occupation: Consulting civil engineer    Comment: Freshman college  Tobacco Use   Smoking status: Never   Smokeless tobacco: Never  Substance and Sexual Activity   Alcohol use: Yes    Comment: 1 drink/day   Drug use: No   Sexual activity: Yes    Partners: Male    Birth control/protection: I.U.D.  Other Topics Concern   Not on file  Social History Narrative   Has mirena  IUD. Work out regularly.  Social Drivers of Corporate investment banker Strain: Not on file  Food Insecurity: Not on file  Transportation Needs: Not on file  Physical Activity: Not on file  Stress: Not on file  Social Connections: Not on file  Intimate Partner Violence: Not on file    ROS      Objective    BP 108/66   Pulse 72   Ht 5' 8 (1.727 m)   Wt 143 lb (64.9 kg)   LMP 03/25/2024 (Exact Date)   SpO2 100%   BMI 21.74 kg/m   Physical Exam Vitals reviewed.  Constitutional:      Appearance: Normal appearance.  HENT:     Head: Normocephalic.  Pulmonary:     Effort: Pulmonary effort is normal.   Skin:    Comments: She has an oval-shaped lump in the right groin  crease approximately 3 cm in size.  No erythema over the surface of the skin.  No active drainage or discharge.   Neurological:     Mental Status: She is alert and oriented to person, place, and time.   Psychiatric:        Mood and Affect: Mood normal.        Behavior: Behavior normal.         Assessment & Plan:   Problem List Items Addressed This Visit   None Visit Diagnoses       Groin lump    -  Primary   Relevant Orders   CBC with Differential/Platelet   US  PELVIS LIMITED (TRANSABDOMINAL ONLY)     Acute vaginitis       Relevant Orders   NuSwab Vaginitis Plus (VG+)       She has a lump in the groin crease.  Will schedule for ultrasound to better determine if it is a lymph node versus cyst.  I strongly suspect that it is an irritated inflamed lymph node.  I am also going to have her do a pelvic swab just to evaluate for BV or yeast or any STD that could be causing some pelvic inflammation.  Since she has had a swollen  node for almost a month we will go ahead and get a CBC with differential today she is not having other systemic symptoms.  We did discuss that it could be inflammation just from recurrent friction from the harness.   No follow-ups on file.   Duaine German, MD

## 2024-04-06 ENCOUNTER — Ambulatory Visit: Payer: Self-pay | Admitting: Family Medicine

## 2024-04-06 LAB — CBC WITH DIFFERENTIAL/PLATELET
Basophils Absolute: 0.1 10*3/uL (ref 0.0–0.2)
Basos: 1 %
EOS (ABSOLUTE): 0.1 10*3/uL (ref 0.0–0.4)
Eos: 2 %
Hematocrit: 43.4 % (ref 34.0–46.6)
Hemoglobin: 14.2 g/dL (ref 11.1–15.9)
Immature Grans (Abs): 0 10*3/uL (ref 0.0–0.1)
Immature Granulocytes: 0 %
Lymphocytes Absolute: 1.4 10*3/uL (ref 0.7–3.1)
Lymphs: 26 %
MCH: 30.3 pg (ref 26.6–33.0)
MCHC: 32.7 g/dL (ref 31.5–35.7)
MCV: 93 fL (ref 79–97)
Monocytes Absolute: 0.4 10*3/uL (ref 0.1–0.9)
Monocytes: 7 %
Neutrophils Absolute: 3.6 10*3/uL (ref 1.4–7.0)
Neutrophils: 64 %
Platelets: 162 10*3/uL (ref 150–450)
RBC: 4.69 x10E6/uL (ref 3.77–5.28)
RDW: 11.9 % (ref 11.7–15.4)
WBC: 5.6 10*3/uL (ref 3.4–10.8)

## 2024-04-06 NOTE — Progress Notes (Signed)
 Chesni, blood count looks good.

## 2024-04-08 LAB — NUSWAB VAGINITIS PLUS (VG+)
Atopobium vaginae: HIGH {score} — AB
BVAB 2: HIGH {score} — AB
Candida albicans, NAA: NEGATIVE
Candida glabrata, NAA: NEGATIVE
Megasphaera 1: HIGH {score} — AB

## 2024-04-09 MED ORDER — METRONIDAZOLE 500 MG PO TABS
500.0000 mg | ORAL_TABLET | Freq: Two times a day (BID) | ORAL | 0 refills | Status: AC
  Filled 2024-04-09: qty 14, 7d supply, fill #0

## 2024-04-09 NOTE — Progress Notes (Signed)
 Swab positive for bacterial vaginitis. Will treat with metronidazole.

## 2024-04-10 ENCOUNTER — Other Ambulatory Visit (HOSPITAL_BASED_OUTPATIENT_CLINIC_OR_DEPARTMENT_OTHER): Payer: Self-pay

## 2024-04-10 ENCOUNTER — Encounter: Payer: Self-pay | Admitting: Family Medicine

## 2024-04-10 NOTE — Progress Notes (Signed)
 Hi Patricia Hammond, ultrasound shows that the lump is most consistent with either a sebum cyst or small abscess.  Being that it did not have any redness around it I think it is more consistent with a sebaceous cyst and it has been there for quite some time.  These typically have to be cut out to be completely removed if you just drained it they come right back.  I would like to refer you for surgical removal if that something you would feel comfortable with.  Please just let me know.

## 2024-04-11 ENCOUNTER — Other Ambulatory Visit: Payer: Self-pay | Admitting: Family Medicine

## 2024-04-11 DIAGNOSIS — L723 Sebaceous cyst: Secondary | ICD-10-CM

## 2024-04-11 DIAGNOSIS — R1909 Other intra-abdominal and pelvic swelling, mass and lump: Secondary | ICD-10-CM

## 2024-04-25 DIAGNOSIS — R1909 Other intra-abdominal and pelvic swelling, mass and lump: Secondary | ICD-10-CM | POA: Diagnosis not present

## 2024-05-21 DIAGNOSIS — F330 Major depressive disorder, recurrent, mild: Secondary | ICD-10-CM | POA: Diagnosis not present

## 2024-05-29 DIAGNOSIS — F330 Major depressive disorder, recurrent, mild: Secondary | ICD-10-CM | POA: Diagnosis not present

## 2024-06-04 DIAGNOSIS — F330 Major depressive disorder, recurrent, mild: Secondary | ICD-10-CM | POA: Diagnosis not present

## 2024-06-19 DIAGNOSIS — F330 Major depressive disorder, recurrent, mild: Secondary | ICD-10-CM | POA: Diagnosis not present

## 2024-06-25 DIAGNOSIS — F330 Major depressive disorder, recurrent, mild: Secondary | ICD-10-CM | POA: Diagnosis not present

## 2024-07-02 DIAGNOSIS — F330 Major depressive disorder, recurrent, mild: Secondary | ICD-10-CM | POA: Diagnosis not present

## 2024-07-17 DIAGNOSIS — F330 Major depressive disorder, recurrent, mild: Secondary | ICD-10-CM | POA: Diagnosis not present

## 2024-07-24 DIAGNOSIS — F330 Major depressive disorder, recurrent, mild: Secondary | ICD-10-CM | POA: Diagnosis not present

## 2024-07-31 DIAGNOSIS — F330 Major depressive disorder, recurrent, mild: Secondary | ICD-10-CM | POA: Diagnosis not present

## 2024-08-07 DIAGNOSIS — F330 Major depressive disorder, recurrent, mild: Secondary | ICD-10-CM | POA: Diagnosis not present

## 2024-08-21 DIAGNOSIS — F330 Major depressive disorder, recurrent, mild: Secondary | ICD-10-CM | POA: Diagnosis not present

## 2024-08-28 DIAGNOSIS — F330 Major depressive disorder, recurrent, mild: Secondary | ICD-10-CM | POA: Diagnosis not present
